# Patient Record
Sex: Female | Born: 1990
Health system: Southern US, Community
[De-identification: ages and names within clinical notes are randomized; demographics above are authoritative.]

## PROBLEM LIST (undated history)

## (undated) DIAGNOSIS — A4902 Methicillin resistant Staphylococcus aureus infection, unspecified site: Secondary | ICD-10-CM

## (undated) DIAGNOSIS — G8929 Other chronic pain: Secondary | ICD-10-CM

## (undated) DIAGNOSIS — F32A Depression, unspecified: Secondary | ICD-10-CM

## (undated) DIAGNOSIS — J45909 Unspecified asthma, uncomplicated: Secondary | ICD-10-CM

## (undated) DIAGNOSIS — M542 Cervicalgia: Secondary | ICD-10-CM

## (undated) DIAGNOSIS — Z8742 Personal history of other diseases of the female genital tract: Secondary | ICD-10-CM

## (undated) DIAGNOSIS — F329 Major depressive disorder, single episode, unspecified: Secondary | ICD-10-CM

## (undated) DIAGNOSIS — F419 Anxiety disorder, unspecified: Secondary | ICD-10-CM

## (undated) HISTORY — PX: TONSILLECTOMY: SUR1361

## (undated) HISTORY — PX: WISDOM TOOTH EXTRACTION: SHX21

## (undated) HISTORY — DX: Unspecified asthma, uncomplicated: J45.909

## (undated) HISTORY — PX: DILATION AND CURETTAGE OF UTERUS: SHX78

## (undated) HISTORY — PX: ADENOIDECTOMY: SUR15

## (undated) HISTORY — PX: EXPLORATORY LAPAROTOMY: SUR591

---

## 2014-11-20 DIAGNOSIS — F1990 Other psychoactive substance use, unspecified, uncomplicated: Secondary | ICD-10-CM | POA: Insufficient documentation

## 2016-10-22 ENCOUNTER — Encounter (HOSPITAL_COMMUNITY): Payer: Self-pay | Admitting: Obstetrics and Gynecology

## 2016-10-22 ENCOUNTER — Other Ambulatory Visit (HOSPITAL_COMMUNITY): Payer: Self-pay | Admitting: Obstetrics and Gynecology

## 2016-10-22 DIAGNOSIS — O280 Abnormal hematological finding on antenatal screening of mother: Secondary | ICD-10-CM

## 2016-11-02 ENCOUNTER — Encounter (HOSPITAL_COMMUNITY): Payer: Self-pay | Admitting: *Deleted

## 2016-11-05 ENCOUNTER — Encounter (HOSPITAL_COMMUNITY): Payer: Self-pay

## 2016-11-05 ENCOUNTER — Ambulatory Visit (HOSPITAL_COMMUNITY)
Admission: RE | Admit: 2016-11-05 | Discharge: 2016-11-05 | Disposition: A | Discharge status: Home / Self Care | Payer: Medicaid Other | Source: Ambulatory Visit | Attending: Obstetrics and Gynecology | Admitting: Obstetrics and Gynecology

## 2016-11-05 DIAGNOSIS — Z3A27 27 weeks gestation of pregnancy: Secondary | ICD-10-CM | POA: Diagnosis not present

## 2016-11-05 DIAGNOSIS — O280 Abnormal hematological finding on antenatal screening of mother: Secondary | ICD-10-CM | POA: Insufficient documentation

## 2016-11-05 NOTE — Progress Notes (Signed)
Genetic Counseling  High-Risk Gestation Note  Appointment Date:  11/05/2016 Referred By: Hassell DoneGaccione, Craig, MD Date of Birth:  May 16, 1991 Partner:  Mcneil SoberNathan Fonseca   Pregnancy History: [id number]: [id number] Estimated Date of Delivery: 01/30/17 Estimated Gestational Age: 972w5d Attending: Charlsie MerlesMark Newman, MD   Brianna Martin and her partner, Mr. Mcneil Soberathan Kall, were seen for consultation for genetic counseling because of an elevated MSAFP of 3.02 MoMs based on maternal serum screening through [facility].    In summary:  Discussed elevated MSAFP   Reviewed possible explanations for elevation  Discussed additional options  Ultrasound- performed today  Amniocentesis- declined  Discussed associations with unexplained elevated MSAFP  Reviewed family history concerns  We reviewed Mrs. Brianna Martin Zeman' maternal serum screening result, the elevation of MSAFP, and the associated 1 in 114 risk for a fetal open neural tube defect.   We reviewed open neural tube defects including: the typical multifactorial etiology and variable prognosis.  In addition, we discussed alternative explanations for an elevated MSAFP including: normal variation, twins, feto-maternal bleeding, a gestational dating error, abdominal wall defects, kidney differences, oligohydramnios, and placental problems.  We discussed that an unexplained elevation of MSAFP is associated with an increased risk for third trimester complications including: prematurity, low birth weight, and pre-eclampsia.    We reviewed additional available screening and diagnostic options including detailed ultrasound and amniocentesis.  We discussed the risks, limitations, and benefits of each.  After thoughtful consideration of these options, [name] elected to have ultrasound only, but declined amniocentesis.  She understands that ultrasound cannot rule out all birth defects or genetic syndromes.  However, she was counseled that 90% of fetuses with open  neural tube defects can be detected by detailed second trimester ultrasound, when well visualized.  A complete ultrasound was performed today. Visualized fetal anatomy within normal limits.  The ultrasound report will be sent under separate cover.  Both family histories were reviewed and found to be noncontributory for birth defects, intellectual disability, and known genetic conditions.  Without further information regarding the provided family history, an accurate genetic risk cannot be calculated. Further genetic counseling is warranted if more information is obtained.  Mrs. Brianna Martin Sautter denied exposure to environmental toxins or chemical agents. She denied the use of alcohol, tobacco or street drugs. She denied significant viral illnesses during the course of her pregnancy. Her medical and surgical histories were noncontributory.   I counseled this couple for approximately 35 minutes regarding the above risks and available options.    Quinn PlowmanKaren Neeta Storey, MS,  Certified Genetic Counselor 11/05/2016 11:11 AM

## 2016-11-05 NOTE — Addendum Note (Signed)
Encounter addended by: Aundra MilletKiser, Tanae Petrosky E on: 11/05/2016 11:00 AM<BR>    Actions taken: Imaging Exam ended

## 2016-11-06 ENCOUNTER — Other Ambulatory Visit (HOSPITAL_COMMUNITY): Payer: Self-pay | Admitting: *Deleted

## 2016-11-06 DIAGNOSIS — O280 Abnormal hematological finding on antenatal screening of mother: Secondary | ICD-10-CM

## 2016-11-08 ENCOUNTER — Ambulatory Visit (HOSPITAL_COMMUNITY): Payer: Self-pay

## 2016-11-08 ENCOUNTER — Encounter (HOSPITAL_COMMUNITY): Payer: Self-pay

## 2016-11-09 ENCOUNTER — Encounter (HOSPITAL_COMMUNITY): Payer: Self-pay

## 2016-11-09 ENCOUNTER — Other Ambulatory Visit: Payer: Self-pay

## 2016-12-07 ENCOUNTER — Ambulatory Visit (HOSPITAL_COMMUNITY)
Admission: RE | Admit: 2016-12-07 | Discharge: 2016-12-07 | Disposition: A | Discharge status: Home / Self Care | Payer: Medicaid Other | Source: Ambulatory Visit | Attending: Obstetrics and Gynecology | Admitting: Obstetrics and Gynecology

## 2016-12-07 ENCOUNTER — Encounter (HOSPITAL_COMMUNITY): Payer: Self-pay

## 2017-04-29 ENCOUNTER — Encounter (HOSPITAL_COMMUNITY): Payer: Self-pay

## 2018-06-03 ENCOUNTER — Ambulatory Visit (INDEPENDENT_AMBULATORY_CARE_PROVIDER_SITE_OTHER): Payer: Medicaid Other | Admitting: Family Medicine

## 2018-06-05 ENCOUNTER — Ambulatory Visit (INDEPENDENT_AMBULATORY_CARE_PROVIDER_SITE_OTHER): Payer: 59 | Admitting: Sports Medicine

## 2018-06-05 ENCOUNTER — Encounter: Payer: Self-pay | Admitting: Sports Medicine

## 2018-06-05 ENCOUNTER — Ambulatory Visit (INDEPENDENT_AMBULATORY_CARE_PROVIDER_SITE_OTHER): Payer: 59

## 2018-06-05 VITALS — BP 110/69 | HR 75 | Resp 16 | Ht 62.0 in | Wt 158.0 lb

## 2018-06-05 DIAGNOSIS — M779 Enthesopathy, unspecified: Secondary | ICD-10-CM

## 2018-06-05 DIAGNOSIS — M79671 Pain in right foot: Secondary | ICD-10-CM | POA: Diagnosis not present

## 2018-06-05 DIAGNOSIS — M21622 Bunionette of left foot: Secondary | ICD-10-CM

## 2018-06-05 DIAGNOSIS — F411 Generalized anxiety disorder: Secondary | ICD-10-CM | POA: Diagnosis not present

## 2018-06-05 DIAGNOSIS — E282 Polycystic ovarian syndrome: Secondary | ICD-10-CM | POA: Diagnosis not present

## 2018-06-05 DIAGNOSIS — Z6828 Body mass index (BMI) 28.0-28.9, adult: Secondary | ICD-10-CM | POA: Diagnosis not present

## 2018-06-05 DIAGNOSIS — Z0000 Encounter for general adult medical examination without abnormal findings: Secondary | ICD-10-CM | POA: Diagnosis not present

## 2018-06-05 DIAGNOSIS — M21621 Bunionette of right foot: Secondary | ICD-10-CM | POA: Diagnosis not present

## 2018-06-05 DIAGNOSIS — M79672 Pain in left foot: Secondary | ICD-10-CM | POA: Diagnosis not present

## 2018-06-05 MED ORDER — MELOXICAM 15 MG PO TABS: 15.0000 mg | ORAL_TABLET | Freq: Every day | ORAL | 0 refills | Status: DC

## 2018-06-05 MED ORDER — TRIAMCINOLONE ACETONIDE 10 MG/ML IJ SUSP
10.0000 mg | Freq: Once | INTRAMUSCULAR | Status: AC
  Administered 2018-06-05: 10 mg

## 2018-06-05 MED FILL — LORazepam 0.5 MG TABS: 0.5 | 30 days supply | Qty: 30 | Fill #0

## 2018-06-05 MED FILL — metFORMIN HCL 500 MG TABS: 500 | 90 days supply | Qty: 45 | Fill #0

## 2018-06-05 NOTE — Progress Notes (Signed)
Subjective: Brianna FieldsKelli Renee Martin is a 27 y.o. female patient who presents to office for evaluation of left greater than right foot pain. Patient complains of progressive pain especially over the last 6 weeks at the lateral side of the left greater than right foot at area of bunion. Ranks pain 8/10 and is now interferring with daily activities. Patient has tried Advil, Aleve, Epson salt soaks on occasion with no relief in symptoms.  Patient reports that pain is worse after work.  She has significant redness and swelling especially on the left. Patient denies any other pedal complaints. Denies injury/trip/fall/sprain/any causative factors.   Review of Systems  Musculoskeletal: Positive for joint pain and myalgias.       Joint swelling  All other systems reviewed and are negative.    Patient Active Problem List   Diagnosis Date Noted  . Abnormal MSAFP (maternal serum alpha-fetoprotein), elevated 11/05/2016  . Psychoactive substance use disorder 11/20/2014    Current Outpatient Medications on File Prior to Visit  Medication Sig Dispense Refill  . metFORMIN (GLUCOPHAGE) 1000 MG tablet Take 1,000 mg by mouth 2 (two) times daily with a meal.    . norethindrone (MICRONOR,CAMILA,ERRIN) 0.35 MG tablet Take by mouth.     No current facility-administered medications on file prior to visit.     No Known Allergies  Objective:  General: Alert and oriented x3 in no acute distress  Dermatology: No open lesions bilateral lower extremities, no webspace macerations, no ecchymosis bilateral, all nails x 10 are well manicured.  Vascular: Dorsalis Pedis and Posterior Tibial pedal pulses palpable, Capillary Fill Time 3 seconds,(+) pedal hair growth bilateral, no edema bilateral lower extremities, Temperature gradient within normal limits.  Neurology: Michaell CowingGross sensation intact via light touch bilateral.  Musculoskeletal: Mild tenderness with palpation at fifth metatarsal phalangeal joint on left at the tailor's  bunion.  No pain to right.  Strength within normal limits in all groups bilateral.   Gait: Antalgic gait  Xrays  Left and right foot   Impression: Normal osseous mineralization there is increased left greater than right 4-5 intermetatarsal angle supportive of tailor's bunion deformity with varus rotation of fifth toe, moderate soft tissue swelling on left.  Assessment and Plan: Problem List Items Addressed This Visit    None    Visit Diagnoses    Capsulitis    -  Primary   Relevant Medications   meloxicam (MOBIC) 15 MG tablet   triamcinolone acetonide (KENALOG) 10 MG/ML injection 10 mg (Completed)   Tailor's bunion of both feet       L>R   Relevant Medications   meloxicam (MOBIC) 15 MG tablet   Bilateral foot pain       Relevant Orders   DG Foot Complete Right   DG Foot Complete Left       -Complete examination performed -Xrays reviewed -Discussed treatement options for tailor's bunion with inflamed joint capsule left -After oral consent and aseptic prep, injected a mixture containing 1 ml of 2%  plain lidocaine, 1 ml 0.5% plain marcaine, 0.5 ml of kenalog 10 and 0.5 ml of dexamethasone phosphate into left fifth metatarsophalangeal joint without complication. Post-injection care discussed with patient.  -Prescribed meloxicam to take as instructed -Dispensed tailor bunions padding -Recommend good supportive shoes -Patient to return to office as needed or sooner if condition worsens.  Advised patient to call if pain is still present after 1 month or when ready for surgery consult for tailors bunion on left.  [name], DPM

## 2018-06-05 NOTE — Progress Notes (Signed)
   Subjective:    Patient ID: Brianna FieldsKelli Renee [place], female    DOB: 1991-03-23, 27 y.o.   MRN: [id number]  HPI    Review of Systems  Musculoskeletal: Positive for arthralgias, joint swelling and myalgias.  All other systems reviewed and are negative.      Objective:   Physical Exam        Assessment & Plan:

## 2018-06-06 ENCOUNTER — Telehealth: Payer: Self-pay | Admitting: Sports Medicine

## 2018-06-06 DIAGNOSIS — M21621 Bunionette of right foot: Secondary | ICD-10-CM

## 2018-06-06 DIAGNOSIS — M21622 Bunionette of left foot: Secondary | ICD-10-CM

## 2018-06-06 DIAGNOSIS — M779 Enthesopathy, unspecified: Secondary | ICD-10-CM

## 2018-06-06 MED ORDER — MELOXICAM 15 MG PO TABS: 15.0000 mg | ORAL_TABLET | Freq: Every day | ORAL | 0 refills | Status: DC

## 2018-06-06 MED FILL — MELOXICAM 15 MG TABLET: 15 | 30 days supply | Qty: 30 | Fill #0

## 2018-06-06 NOTE — Addendum Note (Signed)
Addended by: Alphia Kava'CONNELL, Adamarie Izzo D on: 06/06/2018 11:38 AM   Modules accepted: Orders

## 2018-06-06 NOTE — Telephone Encounter (Signed)
Orders [facility] to [facility].

## 2018-06-06 NOTE — Telephone Encounter (Signed)
Please send [facility]. She will take printed medication to Pharmacy when she picks it up

## 2018-06-13 ENCOUNTER — Other Ambulatory Visit: Payer: Self-pay | Admitting: Sports Medicine

## 2018-06-13 DIAGNOSIS — M79672 Pain in left foot: Principal | ICD-10-CM

## 2018-06-13 DIAGNOSIS — M21622 Bunionette of left foot: Secondary | ICD-10-CM

## 2018-06-13 DIAGNOSIS — M21621 Bunionette of right foot: Secondary | ICD-10-CM

## 2018-06-13 DIAGNOSIS — M779 Enthesopathy, unspecified: Secondary | ICD-10-CM

## 2018-06-13 DIAGNOSIS — M79671 Pain in right foot: Secondary | ICD-10-CM

## 2018-06-20 ENCOUNTER — Encounter: Payer: Self-pay | Admitting: Sports Medicine

## 2018-06-26 ENCOUNTER — Telehealth: Payer: Self-pay | Admitting: *Deleted

## 2018-06-26 NOTE — Telephone Encounter (Signed)
"  I'm calling about scheduling a surgery with Dr. Marylene Land.  Call me back."

## 2018-06-26 NOTE — Telephone Encounter (Signed)
"  I was told to give you a call to set up my surgery.  I'm scheduled to see Dr. Marylene Land on next Friday."  I don't have any information on you regarding the procedures and the location of the facility.  "So should I wait to see Dr. Marylene Land before I get it scheduled."  Yes, that is usually the protocol.  "Okay, thank you."

## 2018-07-04 ENCOUNTER — Ambulatory Visit (INDEPENDENT_AMBULATORY_CARE_PROVIDER_SITE_OTHER): Payer: 59 | Admitting: Sports Medicine

## 2018-07-04 ENCOUNTER — Encounter: Payer: Self-pay | Admitting: Sports Medicine

## 2018-07-04 DIAGNOSIS — M21622 Bunionette of left foot: Secondary | ICD-10-CM

## 2018-07-04 DIAGNOSIS — M79672 Pain in left foot: Secondary | ICD-10-CM | POA: Diagnosis not present

## 2018-07-04 DIAGNOSIS — M779 Enthesopathy, unspecified: Secondary | ICD-10-CM

## 2018-07-04 DIAGNOSIS — M79671 Pain in right foot: Secondary | ICD-10-CM

## 2018-07-04 DIAGNOSIS — M21621 Bunionette of right foot: Secondary | ICD-10-CM

## 2018-07-04 DIAGNOSIS — M2040 Other hammer toe(s) (acquired), unspecified foot: Secondary | ICD-10-CM | POA: Diagnosis not present

## 2018-07-04 NOTE — Progress Notes (Signed)
Subjective: Brianna Martin is a 28 y.o. female patient who returns to office for follow-up evaluation of left greater than right foot pain. Patient reports that she continues to have pain over her tailor's bunion especially on her left foot.  Patient states that the injection helped for a few days but then the pain is right back and very severe states that she is currently taking the meloxicam but feels like it is not helping and that pain is off and on at worst 6 out of 10.  Patient reports that the pain is consistently worse after work and that she has tried everything that she can think of including wider shoes rest ice elevation and additional padding without any improvement.  Patient states that she is here to discuss surgery.  Patient denies any other pedal complaints at this time.   Patient Active Problem List   Diagnosis Date Noted  . Abnormal MSAFP (maternal serum alpha-fetoprotein), elevated 11/05/2016  . Psychoactive substance use disorder 11/20/2014    Current Outpatient Medications on File Prior to Visit  Medication Sig Dispense Refill  . glyBURIDE-metformin (GLUCOVANCE) 1.25-250 MG tablet Take 1 tablet by mouth daily with breakfast.    . meloxicam (MOBIC) 15 MG tablet Take 1 tablet (15 mg total) by mouth daily. 30 tablet 0   No current facility-administered medications on file prior to visit.     No Known Allergies   History reviewed. No pertinent family history.  Past Surgical History:  Procedure Laterality Date  . CESAREAN SECTION    . DILATION AND CURETTAGE OF UTERUS    . EXPLORATORY LAPAROTOMY    . TONSILLECTOMY    . WISDOM TOOTH EXTRACTION     Social History   Socioeconomic History  . Marital status: Married    Spouse name: Not on file  . Number of children: Not on file  . Years of education: Not on file  . Highest education level: Not on file  Occupational History  . Not on file  Social Needs  . Financial resource strain: Not on file  . Food  insecurity:    Worry: Not on file    Inability: Not on file  . Transportation needs:    Medical: Not on file    Non-medical: Not on file  Tobacco Use  . Smoking status: Former Smoker    Packs/day: 0.50    Years: 2.00    Pack years: 1.00    Types: Cigarettes    Last attempt to quit: 06/21/2016    Years since quitting: 2.0  . Smokeless tobacco: Never Used  . Tobacco comment: no cigs, pt vapes now since 05/2016  Substance and Sexual Activity  . Alcohol use: No  . Drug use: No  . Sexual activity: Yes    Birth control/protection: None  Lifestyle  . Physical activity:    Days per week: Not on file    Minutes per session: Not on file  . Stress: Not on file  Relationships  . Social connections:    Talks on phone: Not on file    Gets together: Not on file    Attends religious service: Not on file    Active member of club or organization: Not on file    Attends meetings of clubs or organizations: Not on file    Relationship status: Not on file  . Intimate partner violence:    Fear of current or ex partner: Not on file    Emotionally abused: Not on file  Physically abused: Not on file    Forced sexual activity: Not on file  Other Topics Concern  . Not on file  Social History Narrative  . Not on file   Objective:  General: Alert and oriented x3 in no acute distress  Dermatology: No open lesions bilateral lower extremities, no webspace macerations, no ecchymosis bilateral, all nails x 10 are well manicured.  Vascular: Dorsalis Pedis and Posterior Tibial pedal pulses palpable, Capillary Fill Time 3 seconds,(+) pedal hair growth bilateral, no edema bilateral lower extremities, Temperature gradient within normal limits.  Neurology: Michaell CowingGross sensation intact via light touch bilateral.  Musculoskeletal: Mild tenderness with palpation at fifth metatarsal phalangeal joint on left at the tailor's bunion.  5th hammertoe deformity.  No pain to right.  Strength within normal limits in all  groups bilateral.   Assessment and Plan: Problem List Items Addressed This Visit    None    Visit Diagnoses    Capsulitis    -  Primary   Tailor's bunion of both feet       Left greater than right   Bilateral foot pain       Left greater than right   Hammer toe, unspecified laterality           -Complete examination performed -Xrays reviewed -Discussed treatement options for tailor's bunion with inflamed joint capsule left with hammertoe -Patient opt for surgical management. Consent obtained for left tailor's bunionectomy with internal fixation and fifth hammertoe repair.  Pre and Post op course explained. Risks, benefits, alternatives explained. No guarantees given or implied. Surgical booking slip submitted and provided patient with Surgical packet and info for [facility] day.  Patient to get history and physical completed. -Dispensed wedge postop shoe to use postoperatively and advised patient at surgical center we will see if she also needs crutches as well to help keep her steady in gait -Advised patient that the first week to 2 she will have to limit her weightbearing and after 1 week may go back to work however must have a sedentary job where she can elevate her foot -Patient to return to office after surgery or sooner if problems or issues arise.  Asencion Islamitorya Amethyst Gainer, DPM

## 2018-07-04 NOTE — Patient Instructions (Signed)
Pre-Operative Instructions  Congratulations, you have decided to take an important step towards improving your quality of life.  You can be assured that the doctors and staff at [facility] will be with you every step of the way.  Here are some important things you should know:  1. Plan to be at the surgery center/hospital at least 1 (one) hour prior to your scheduled time, unless otherwise directed by the surgical center/hospital staff.  You must have a responsible adult accompany you, remain during the surgery and drive you home.  Make sure you have directions to the surgical center/hospital to ensure you arrive on time. 2. If you are having surgery at [facility] or [facility] hospitals, you will need a copy of your medical history and physical form from your family physician within one month prior to the date of surgery. We will give you a form for your primary physician to complete.  3. We make every effort to accommodate the date you request for surgery.  However, there are times where surgery dates or times have to be moved.  We will contact you as soon as possible if a change in schedule is required.   4. No aspirin/ibuprofen for one week before surgery.  If you are on aspirin, any non-steroidal anti-inflammatory medications (Mobic, Aleve, Ibuprofen) should not be taken seven (7) days prior to your surgery.  You make take Tylenol for pain prior to surgery.  5. Medications - If you are taking daily heart and blood pressure medications, seizure, reflux, allergy, asthma, anxiety, pain or diabetes medications, make sure you notify the surgery center/hospital before the day of surgery so they can tell you which medications you should take or avoid the day of surgery. 6. No food or drink after midnight the night before surgery unless directed otherwise by surgical center/hospital staff. 7. No alcoholic beverages 24-hours prior to surgery.  No smoking 24-hours prior or 24-hours after  surgery. 8. Wear loose pants or shorts. They should be loose enough to fit over bandages, boots, and casts. 9. Don't wear slip-on shoes. Sneakers are preferred. 10. Bring your boot with you to the surgery center/hospital.  Also bring crutches or a walker if your physician has prescribed it for you.  If you do not have this equipment, it will be provided for you after surgery. 11. If you have not been contacted by the surgery center/hospital by the day before your surgery, call to confirm the date and time of your surgery. 12. Leave-time from work may vary depending on the type of surgery you have.  Appropriate arrangements should be made prior to surgery with your employer. 13. Prescriptions will be provided immediately following surgery by your doctor.  Fill these as soon as possible after surgery and take the medication as directed. Pain medications will not be refilled on weekends and must be approved by the doctor. 14. Remove nail polish on the operative foot and avoid getting pedicures prior to surgery. 15. Wash the night before surgery.  The night before surgery wash the foot and leg well with water and the antibacterial soap provided. Be sure to pay special attention to beneath the toenails and in between the toes.  Wash for at least three (3) minutes. Rinse thoroughly with water and dry well with a towel.  Perform this wash unless told not to do so by your physician.  Enclosed: 1 Ice pack (please put in freezer the night before surgery)   1 Hibiclens skin cleaner     Pre-op instructions  If you have any questions regarding the instructions, please do not hesitate to call our office.  [place]: [address], [place], [place] [address] -- [phone]  [place]: [address] [place], [place] [address] -- [phone]  [place]: [address]  [place], [place] [address] -- [phone]  [place]: [address], [address], [place], [place] [address] -- [phone]  Website:  [email or web address] 

## 2018-07-07 ENCOUNTER — Telehealth: Payer: Self-pay | Admitting: *Deleted

## 2018-07-07 NOTE — Telephone Encounter (Signed)
"  I'm calling to schedule my surgery."  Have you signed consent forms yet?  "I did on Friday."  Did she say what location your surgery will be performed?  "I'm having it at [facility] because I'm an employee of [facility]."  Do you have a date in mind?  "Yes, I'd like to do it February 17."  Did they give you the forms to have completed by your primary care physician?  Your visit must be between January 17 to February 17.  "Yes, they did."  (I need paperwork for this patient please.)

## 2018-07-08 NOTE — Telephone Encounter (Signed)
"  I'm calling to get my surgery scheduled.  Give me a call back."

## 2018-07-09 NOTE — Telephone Encounter (Signed)
I attempted to return her call.  I left her a message to call me back tomorrow. 

## 2018-07-11 MED FILL — LORazepam 0.5 MG TABS: 0.5 | 30 days supply | Qty: 30 | Fill #0

## 2018-07-15 NOTE — Telephone Encounter (Signed)
I am returning your call.  You want to schedule surgery with Dr. Marylene Land?  "Yes, I do."  Do you have a date that you would like to have it on?  "Yes, I'd like to do it on February 17."  That date is available.  The procedure will start at 1 pm but someone from the surgical center will give you a call a couple of days prior to the surgery date and they will give you your arrival time.  "Okay, thank you so much."

## 2018-07-23 ENCOUNTER — Ambulatory Visit (INDEPENDENT_AMBULATORY_CARE_PROVIDER_SITE_OTHER): Payer: 59 | Admitting: Orthopedic Surgery

## 2018-07-23 ENCOUNTER — Ambulatory Visit (INDEPENDENT_AMBULATORY_CARE_PROVIDER_SITE_OTHER): Payer: 59

## 2018-07-23 ENCOUNTER — Encounter (INDEPENDENT_AMBULATORY_CARE_PROVIDER_SITE_OTHER): Payer: Self-pay | Admitting: Orthopedic Surgery

## 2018-07-23 DIAGNOSIS — G8929 Other chronic pain: Secondary | ICD-10-CM

## 2018-07-23 DIAGNOSIS — M545 Low back pain, unspecified: Secondary | ICD-10-CM

## 2018-07-23 DIAGNOSIS — M542 Cervicalgia: Secondary | ICD-10-CM | POA: Diagnosis not present

## 2018-07-23 NOTE — Progress Notes (Signed)
Office Visit Note   Patient: Brianna Martin           Date of Birth: July 13, 1990           MRN: [id number] Visit Date: 07/23/2018 Requested by: No referring provider defined for this encounter. PCP: Brianna Height, PA-C  Subjective: Chief Complaint  Patient presents with  . Lower Back - Pain  . 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  She had an MRI scan 2015 which showed a little bit of left-sided L5 nerve root impingement.  She had 2 epidural steroid injections at that time which only helped her for a week.  She had an episode where she was having some back pain and she asked her husband to help crack her back.  She had bilateral leg numbness after that and she considered calling 911.  She reports pain primarily localizing in the lumbar sacral region.  Not really in the coccyx.  She has occasional bilateral leg numbness on the outsides of both legs but no saddle paresthesias.  She has been taking Mobic for her foot but that has not been helping her back.  This is the same thing that she had years ago but it is getting worse.  She has pain on a daily basis as well as pain that wakes her from sleep at night.  She also reports some tightness in her neck and shoulder region but no real radicular symptoms and no weakness in the arms.               ROS: All systems reviewed are negative as they relate to the chief complaint within the history of present illness.  Patient denies  fevers or chills.   Assessment & Plan: Visit Diagnoses:  1. Neck pain   2. Chronic low back pain, unspecified back pain laterality, unspecified whether sciatica present     Plan: Impression is neck pain with some loss of lordosis and likely muscle spasm in the neck but normal radiographs and normal exam.  I think that something we can watch.  The lower back is a little bit more problematic.  This does not look like a pilonidal cyst or anything of that  nature.  She does have some loss of her sacral contour on the AP pelvis on the right-hand side.  I think with her symptoms we should get MRI of the sacrum and the low back.  She is having night pain and worsening symptoms over the last 5 years.  There was some areas of increased signal in the sacrum on her prior MRI scan from 5 years ago.  We need to compare that to the new study.  Follow-Up Instructions: Return for after MRI.   Orders:  Orders Placed This Encounter  Procedures  . XR Lumbar Spine 2-3 Views  . XR Cervical Spine 2 or 3 views   No orders of the defined types were placed in this encounter.     Procedures: No procedures performed   Clinical Data: No additional findings.  Objective: Vital Signs: There were no vitals taken for this visit.  Physical Exam:   Constitutional: Patient appears well-developed HEENT:  Head: Normocephalic Eyes:EOM are normal Neck: Normal range of motion Cardiovascular: Normal rate Pulmonary/chest: Effort normal Neurologic: Patient is alert Skin: Skin is warm Psychiatric: Patient has normal mood and affect    Ortho Exam: Ortho exam demonstrates full active and passive  range of motion of the cervical spine.  Patient has 5 out of 5 grip EPL FPL interosseous wrist flexion extension bicep triceps and deltoid strength.  No paresthesias C5-T1.  Reflexes symmetric bilateral biceps triceps.  Not much in the way of nerve root tension signs.  No skin lesions in that sacral region.  Does have a little bit of tenderness to palpation at the top of the gluteal crease.  No trochanteric tenderness is noted.  Some pain with forward and lateral bending.  No muscle atrophy in the legs.  Specialty Comments:  No specialty comments available.  Imaging: Xr Cervical Spine 2 Or 3 Views  Result Date: 07/23/2018 AP lateral cervical spine reviewed.  There is loss of lordosis.  No significant degenerative disc disease or facet arthritis is present.  No malalignment  noted.  Xr Lumbar Spine 2-3 Views  Result Date: 07/23/2018 AP lateral lumbar spine reviewed.  Visualized hips appear normal.  Sacroiliac joints normal.  Lumbar spine alignment normal.  No significant degenerative disc disease present and no facet arthritis is present.    PMFS History: Patient Active Problem List   Diagnosis Date Noted  . Abnormal MSAFP (maternal serum alpha-fetoprotein), elevated 11/05/2016  . Psychoactive substance use disorder 11/20/2014   Past Medical History:  Diagnosis Date  . Medical history non-contributory     History reviewed. No pertinent family history.  Past Surgical History:  Procedure Laterality Date  . CESAREAN SECTION    . DILATION AND CURETTAGE OF UTERUS    . EXPLORATORY LAPAROTOMY    . TONSILLECTOMY    . WISDOM TOOTH EXTRACTION     Social History   Occupational History  . Not on file  Tobacco Use  . Smoking status: Former Smoker    Packs/day: 0.50    Years: 2.00    Pack years: 1.00    Types: Cigarettes    Last attempt to quit: 06/21/2016    Years since quitting: 2.0  . Smokeless tobacco: Never Used  . Tobacco comment: no cigs, pt vapes now since 05/2016  Substance and Sexual Activity  . Alcohol use: No  . Drug use: No  . Sexual activity: Yes    Birth control/protection: None

## 2018-07-24 ENCOUNTER — Encounter (INDEPENDENT_AMBULATORY_CARE_PROVIDER_SITE_OTHER): Payer: Self-pay | Admitting: Orthopedic Surgery

## 2018-07-24 ENCOUNTER — Encounter: Payer: Self-pay | Admitting: Sports Medicine

## 2018-07-25 ENCOUNTER — Telehealth: Payer: Self-pay | Admitting: *Deleted

## 2018-07-25 MED ORDER — TRAMADOL HCL 50 MG PO TABS: ORAL_TABLET | ORAL | 0 refills | Status: DC

## 2018-07-25 MED FILL — traMADol HCL 50 MG TABS: 50 | 6 days supply | Qty: 20 | Fill #0

## 2018-07-25 NOTE — Telephone Encounter (Signed)
Left tramadol orders.

## 2018-07-27 NOTE — Telephone Encounter (Signed)
Loss of sacral contouring on the right which means I cannot really see the cortical edge of the bone is clearly in the sacrum on the right is on the left.  For that reason and in accordance with the pain that appears to be primarily below and within your gluteal region MRI of both the lower back and sacrum is indicated

## 2018-08-01 ENCOUNTER — Other Ambulatory Visit: Payer: Self-pay

## 2018-08-01 ENCOUNTER — Encounter (HOSPITAL_BASED_OUTPATIENT_CLINIC_OR_DEPARTMENT_OTHER): Payer: Self-pay | Admitting: *Deleted

## 2018-08-04 MED FILL — ESCITALOPRAM 10 MG TABLET: 10 | 30 days supply | Qty: 30 | Fill #0

## 2018-08-06 ENCOUNTER — Ambulatory Visit
Admission: RE | Admit: 2018-08-06 | Discharge: 2018-08-06 | Disposition: A | Discharge status: Home / Self Care | Payer: Medicaid Other | Source: Ambulatory Visit | Attending: Orthopedic Surgery | Admitting: Orthopedic Surgery

## 2018-08-06 ENCOUNTER — Telehealth: Payer: Self-pay | Admitting: *Deleted

## 2018-08-06 ENCOUNTER — Ambulatory Visit
Admission: RE | Admit: 2018-08-06 | Discharge: 2018-08-06 | Disposition: A | Discharge status: Home / Self Care | Payer: 59 | Source: Ambulatory Visit | Attending: Orthopedic Surgery | Admitting: Orthopedic Surgery

## 2018-08-06 DIAGNOSIS — M545 Low back pain: Principal | ICD-10-CM

## 2018-08-06 DIAGNOSIS — M5124 Other intervertebral disc displacement, thoracic region: Secondary | ICD-10-CM | POA: Diagnosis not present

## 2018-08-06 DIAGNOSIS — G8929 Other chronic pain: Secondary | ICD-10-CM

## 2018-08-06 DIAGNOSIS — M48061 Spinal stenosis, lumbar region without neurogenic claudication: Secondary | ICD-10-CM | POA: Diagnosis not present

## 2018-08-06 DIAGNOSIS — M549 Dorsalgia, unspecified: Secondary | ICD-10-CM | POA: Diagnosis not present

## 2018-08-06 NOTE — Telephone Encounter (Signed)
Doing it now.

## 2018-08-06 NOTE — Telephone Encounter (Signed)
"  I need Dr. Marylene Land to put in orders for [facility].  She's scheduled for Monday."  I'll let her know.

## 2018-08-08 ENCOUNTER — Ambulatory Visit (INDEPENDENT_AMBULATORY_CARE_PROVIDER_SITE_OTHER): Payer: 59 | Admitting: Allergy

## 2018-08-08 ENCOUNTER — Encounter: Payer: Self-pay | Admitting: Allergy

## 2018-08-08 ENCOUNTER — Telehealth: Payer: Self-pay | Admitting: *Deleted

## 2018-08-08 ENCOUNTER — Encounter (HOSPITAL_BASED_OUTPATIENT_CLINIC_OR_DEPARTMENT_OTHER)
Admission: RE | Admit: 2018-08-08 | Discharge: 2018-08-08 | Disposition: A | Discharge status: Home / Self Care | Payer: 59 | Source: Ambulatory Visit | Attending: Sports Medicine | Admitting: Sports Medicine

## 2018-08-08 VITALS — BP 118/82 | HR 76 | Temp 97.8°F | Resp 16 | Ht 62.0 in | Wt 159.0 lb

## 2018-08-08 DIAGNOSIS — M21622 Bunionette of left foot: Secondary | ICD-10-CM | POA: Diagnosis not present

## 2018-08-08 DIAGNOSIS — Z01812 Encounter for preprocedural laboratory examination: Secondary | ICD-10-CM

## 2018-08-08 DIAGNOSIS — F419 Anxiety disorder, unspecified: Secondary | ICD-10-CM | POA: Diagnosis not present

## 2018-08-08 DIAGNOSIS — Z7984 Long term (current) use of oral hypoglycemic drugs: Secondary | ICD-10-CM | POA: Diagnosis not present

## 2018-08-08 DIAGNOSIS — Z01810 Encounter for preprocedural cardiovascular examination: Secondary | ICD-10-CM | POA: Diagnosis not present

## 2018-08-08 DIAGNOSIS — M2042 Other hammer toe(s) (acquired), left foot: Secondary | ICD-10-CM | POA: Diagnosis not present

## 2018-08-08 DIAGNOSIS — F17290 Nicotine dependence, other tobacco product, uncomplicated: Secondary | ICD-10-CM | POA: Diagnosis not present

## 2018-08-08 DIAGNOSIS — J45909 Unspecified asthma, uncomplicated: Secondary | ICD-10-CM | POA: Diagnosis not present

## 2018-08-08 DIAGNOSIS — Z0000 Encounter for general adult medical examination without abnormal findings: Secondary | ICD-10-CM

## 2018-08-08 DIAGNOSIS — Z833 Family history of diabetes mellitus: Secondary | ICD-10-CM | POA: Diagnosis not present

## 2018-08-08 DIAGNOSIS — F329 Major depressive disorder, single episode, unspecified: Secondary | ICD-10-CM | POA: Diagnosis not present

## 2018-08-08 LAB — BASIC METABOLIC PANEL
Anion gap: 6 (ref 5–15)
BUN: 14 mg/dL (ref 6–20)
CO2: 27 mmol/L (ref 22–32)
Calcium: 9.7 mg/dL (ref 8.9–10.3)
Chloride: 108 mmol/L (ref 98–111)
Creatinine, Ser: 0.67 mg/dL (ref 0.44–1.00)
GFR calc Af Amer: >60 mL/min (ref >60)
Glucose, Bld: 122 mg/dL — ABNORMAL HIGH (ref 70–99)
Potassium: 5.3 mmol/L — ABNORMAL HIGH (ref 3.5–5.1)
Sodium: 141 mmol/L (ref 135–145)

## 2018-08-08 LAB — POCT PREGNANCY, URINE: Preg Test, Ur: NEGATIVE

## 2018-08-08 NOTE — Telephone Encounter (Addendum)
Brianna Martin from [facility] sent me a message stating that the history and physical was expired, it was not in the 30 day window.  The physical was done in March and the doctor signed the form on 07/09/2018.  So both dates were not within the required time period.  I called and informed Brianna Martin.  She's going to call her doctor's office and see if she can get an appointment for early Monday morning.

## 2018-08-08 NOTE — Telephone Encounter (Signed)
"  I was able to get in with Dr. Margo Aye.  I had my physical and she filled out the form.  Can you make sure you have it?"  Yes, it's here I will fax it to the surgical center.  "Do I need anything else?"  I am not aware of anything else that is needed.  I faxed the form to the surgical center.

## 2018-08-08 NOTE — Telephone Encounter (Addendum)
I received an IM from [place] at the surgical center stating they had not received Brianna Martin's history and physical form.  She's scheduled for surgery on Monday, August 11, 2018.   "I'm calling you in regards to your history and physical form.  We do not have a copy of it.  Can you call your doctor's office and request them to fax it to me.  "Sure, I can do that but I need to do it now because they close at 12 pm.  What's your fax number?"  My fax number is [phone].  "I'll call now."   "I'm calling to see if you received the fax from my doctor's office."  I'll go check.  Yes, we received it.  I'm faxing it as we speak.  Thank you so much.  "Okay great, thank you."

## 2018-08-08 NOTE — Progress Notes (Signed)
Pt here for labs.  Pt aware of need for updated H&P prior to surgery on Monday.  H&P form given.

## 2018-08-08 NOTE — Patient Instructions (Signed)
Healthy adult  - she is in normal state of health  - she has same risk as general population for complications related to surgery.    - she does have not any medical conditions at this time that would put her at risk for complications related to anesthesia.    F/u as needed

## 2018-08-08 NOTE — Progress Notes (Signed)
New Patient Note  RE: Brianna Martin MRN: [id number] DOB: 06/03/1991 Date of Office Visit: 08/08/2018  Referring provider: Gus Height, PA-C Primary care provider: Gus Height, PA-C  Chief Complaint: pre-op clearance  History of present illness: Brianna Martin is a 28 y.o. female presenting today for consultation for pre-op clearance.  She has been having foot pain and is set to have foot surgery with her podiatrist next week.  She otherwise states she has been in her normal state of health without any recent illnesses, hospitalizations.  She denies any previous issues with anesthesia in the past.  She denies any cardiac or respiratory issues.     Review of systems: Review of Systems  Constitutional: Negative for chills, fever and malaise/fatigue.  HENT: Negative for congestion and nosebleeds.   Eyes: Negative for discharge and redness.  Respiratory: Negative for cough, shortness of breath and wheezing.   Cardiovascular: Negative for chest pain.  Gastrointestinal: Negative for abdominal pain, constipation, diarrhea, nausea and vomiting.  Musculoskeletal: Negative for joint pain.  Skin: Negative for itching and rash.  Neurological: Negative for headaches.    All other systems negative unless noted above in HPI  Past medical history: Past Medical History:  Diagnosis Date  . Anxiety   . Asthma   . Depression   . History of PCOS   . Neck pain, chronic     Past surgical history: Past Surgical History:  Procedure Laterality Date  . ADENOIDECTOMY    . CESAREAN SECTION    . DILATION AND CURETTAGE OF UTERUS    . EXPLORATORY LAPAROTOMY    . TONSILLECTOMY    . WISDOM TOOTH EXTRACTION      Family history:  Family History  Problem Relation Age of Onset  . Urticaria Neg Hx   . Immunodeficiency Neg Hx   . Eczema Neg Hx   . Atopy Neg Hx   . Asthma Neg Hx   . Angioedema Neg Hx   . Allergic rhinitis Neg Hx     Social history: Lives with family in home.   She is a CMA.    Medication List: Allergies as of 08/08/2018   No Known Allergies     Medication List       Accurate as of August 08, 2018  5:21 PM. Always use your most recent med list.        escitalopram 10 MG tablet Commonly known as:  LEXAPRO   meloxicam 15 MG tablet Commonly known as:  MOBIC Take 1 tablet (15 mg total) by mouth daily.   metFORMIN 500 MG tablet Commonly known as:  GLUCOPHAGE Take 250 mg by mouth daily with breakfast.       Known medication allergies: No Known Allergies   Physical examination: Blood pressure 118/82, pulse 76, temperature 97.8 F (36.6 C), temperature source Oral, resp. rate 16, height 5\' 2"  (1.575 m), weight 159 lb (72.1 kg), SpO2 99 %, currently breastfeeding.  General: Alert, interactive, in no acute distress. HEENT: PERRLA, TMs pearly gray, turbinates non-edematous without discharge, post-pharynx non erythematous. Neck: Supple without lymphadenopathy. Lungs: Clear to auscultation without wheezing, rhonchi or rales. {no increased work of breathing. CV: Normal S1, S2 without murmurs. Abdomen: Nondistended, nontender. Skin: Warm and dry, without lesions or rashes. Extremities:  No clubbing, cyanosis or edema. Neuro:   Grossly intact.  Diagnositics/Labs: None  Assessment and plan:   Healthy adult  - she is in normal state of health  - she has same risk as general  population for complications related to surgery.    - she does have not any medical conditions at this time that would put her at risk for complications related to anesthesia.    F/u as needed   No follow-ups on file.  I appreciate the opportunity to take part in [facility] care. Please do not hesitate to contact me with questions.  Sincerely,   Margo Aye, MD Allergy/Immunology [facility]

## 2018-08-11 ENCOUNTER — Ambulatory Visit (HOSPITAL_BASED_OUTPATIENT_CLINIC_OR_DEPARTMENT_OTHER)
Admission: RE | Admit: 2018-08-11 | Discharge: 2018-08-11 | Disposition: A | Discharge status: Home / Self Care | Payer: 59 | Attending: Sports Medicine | Admitting: Sports Medicine

## 2018-08-11 ENCOUNTER — Encounter (HOSPITAL_BASED_OUTPATIENT_CLINIC_OR_DEPARTMENT_OTHER)
Admission: RE | Disposition: A | Discharge status: Home / Self Care | Payer: Self-pay | Source: Home / Self Care | Attending: Sports Medicine

## 2018-08-11 ENCOUNTER — Encounter: Payer: Self-pay | Admitting: Sports Medicine

## 2018-08-11 ENCOUNTER — Encounter (HOSPITAL_BASED_OUTPATIENT_CLINIC_OR_DEPARTMENT_OTHER): Payer: Self-pay

## 2018-08-11 ENCOUNTER — Ambulatory Visit (HOSPITAL_BASED_OUTPATIENT_CLINIC_OR_DEPARTMENT_OTHER): Payer: 59 | Admitting: Anesthesiology

## 2018-08-11 ENCOUNTER — Other Ambulatory Visit: Payer: Self-pay

## 2018-08-11 DIAGNOSIS — M2042 Other hammer toe(s) (acquired), left foot: Secondary | ICD-10-CM | POA: Insufficient documentation

## 2018-08-11 DIAGNOSIS — F17290 Nicotine dependence, other tobacco product, uncomplicated: Secondary | ICD-10-CM | POA: Diagnosis not present

## 2018-08-11 DIAGNOSIS — F419 Anxiety disorder, unspecified: Secondary | ICD-10-CM | POA: Insufficient documentation

## 2018-08-11 DIAGNOSIS — F329 Major depressive disorder, single episode, unspecified: Secondary | ICD-10-CM | POA: Diagnosis not present

## 2018-08-11 DIAGNOSIS — Z7984 Long term (current) use of oral hypoglycemic drugs: Secondary | ICD-10-CM | POA: Diagnosis not present

## 2018-08-11 DIAGNOSIS — J45909 Unspecified asthma, uncomplicated: Secondary | ICD-10-CM | POA: Diagnosis not present

## 2018-08-11 DIAGNOSIS — M21622 Bunionette of left foot: Secondary | ICD-10-CM | POA: Diagnosis not present

## 2018-08-11 DIAGNOSIS — Z833 Family history of diabetes mellitus: Secondary | ICD-10-CM | POA: Diagnosis not present

## 2018-08-11 DIAGNOSIS — Z9889 Other specified postprocedural states: Secondary | ICD-10-CM

## 2018-08-11 DIAGNOSIS — M2012 Hallux valgus (acquired), left foot: Secondary | ICD-10-CM | POA: Diagnosis not present

## 2018-08-11 HISTORY — DX: Major depressive disorder, single episode, unspecified: F32.9

## 2018-08-11 HISTORY — DX: Personal history of other diseases of the female genital tract: Z87.42

## 2018-08-11 HISTORY — PX: HAMMER TOE SURGERY: SHX385

## 2018-08-11 HISTORY — DX: Depression, unspecified: F32.A

## 2018-08-11 HISTORY — DX: Anxiety disorder, unspecified: F41.9

## 2018-08-11 HISTORY — DX: Other chronic pain: G89.29

## 2018-08-11 HISTORY — DX: Cervicalgia: M54.2

## 2018-08-11 HISTORY — PX: BUNIONECTOMY: SHX129

## 2018-08-11 HISTORY — DX: Methicillin resistant Staphylococcus aureus infection, unspecified site: A49.02

## 2018-08-11 SURGERY — BUNIONECTOMY
Anesthesia: Monitor Anesthesia Care | Laterality: Left

## 2018-08-11 MED ORDER — ONDANSETRON HCL 4 MG/2ML IJ SOLN
INTRAMUSCULAR | Status: AC
  Filled 2018-08-11: qty 2

## 2018-08-11 MED ORDER — ONDANSETRON HCL 4 MG/2ML IJ SOLN
INTRAMUSCULAR | Status: DC | PRN
  Administered 2018-08-11: 4 mg via INTRAVENOUS

## 2018-08-11 MED ORDER — CEFAZOLIN SODIUM-DEXTROSE 2-4 GM/100ML-% IV SOLN
INTRAVENOUS | Status: AC
  Filled 2018-08-11: qty 100

## 2018-08-11 MED ORDER — CLONIDINE HCL (ANALGESIA) 100 MCG/ML EP SOLN
EPIDURAL | Status: DC | PRN
  Administered 2018-08-11: 50 ug

## 2018-08-11 MED ORDER — PROPOFOL 500 MG/50ML IV EMUL
INTRAVENOUS | Status: DC | PRN
  Administered 2018-08-11: 125 ug/kg/min via INTRAVENOUS

## 2018-08-11 MED ORDER — PROPOFOL 10 MG/ML IV BOLUS
INTRAVENOUS | Status: DC | PRN
  Administered 2018-08-11: 40 mg via INTRAVENOUS
  Administered 2018-08-11: 30 mg via INTRAVENOUS

## 2018-08-11 MED ORDER — LACTATED RINGERS IV SOLN
INTRAVENOUS | Status: DC
  Administered 2018-08-11: 12:00:00 via INTRAVENOUS

## 2018-08-11 MED ORDER — ACETAMINOPHEN 500 MG PO TABS
1000.0000 mg | ORAL_TABLET | Freq: Once | ORAL | Status: AC
  Administered 2018-08-11: 1000 mg via ORAL

## 2018-08-11 MED ORDER — BUPIVACAINE HCL (PF) 0.5 % IJ SOLN
INTRAMUSCULAR | Status: AC
  Filled 2018-08-11: qty 30

## 2018-08-11 MED ORDER — DEXMEDETOMIDINE HCL IN NACL 200 MCG/50ML IV SOLN
INTRAVENOUS | Status: DC | PRN
  Administered 2018-08-11: 8 ug via INTRAVENOUS

## 2018-08-11 MED ORDER — HYDROCODONE-ACETAMINOPHEN 10-325 MG PO TABS: 1.0000 | ORAL_TABLET | Freq: Four times a day (QID) | ORAL | 0 refills | Status: AC | PRN

## 2018-08-11 MED ORDER — DOCUSATE SODIUM 100 MG PO CAPS: 100.0000 mg | ORAL_CAPSULE | Freq: Every day | ORAL | 2 refills | Status: AC | PRN

## 2018-08-11 MED ORDER — MIDAZOLAM HCL 2 MG/2ML IJ SOLN
1.0000 mg | INTRAMUSCULAR | Status: DC | PRN
  Administered 2018-08-11: 2 mg via INTRAVENOUS

## 2018-08-11 MED ORDER — ROPIVACAINE HCL 7.5 MG/ML IJ SOLN
INTRAMUSCULAR | Status: DC | PRN
  Administered 2018-08-11: 20 mL via PERINEURAL

## 2018-08-11 MED ORDER — LIDOCAINE HCL (PF) 1 % IJ SOLN
INTRAMUSCULAR | Status: AC
  Filled 2018-08-11: qty 30

## 2018-08-11 MED ORDER — FENTANYL CITRATE (PF) 100 MCG/2ML IJ SOLN
INTRAMUSCULAR | Status: AC
  Filled 2018-08-11: qty 2

## 2018-08-11 MED ORDER — CHLORHEXIDINE GLUCONATE CLOTH 2 % EX PADS: 6.0000 | MEDICATED_PAD | Freq: Once | CUTANEOUS | Status: DC

## 2018-08-11 MED ORDER — CEFAZOLIN SODIUM-DEXTROSE 2-4 GM/100ML-% IV SOLN
2.0000 g | INTRAVENOUS | Status: AC
  Administered 2018-08-11: 2 g via INTRAVENOUS

## 2018-08-11 MED ORDER — MIDAZOLAM HCL 2 MG/2ML IJ SOLN
INTRAMUSCULAR | Status: AC
  Filled 2018-08-11: qty 2

## 2018-08-11 MED ORDER — SCOPOLAMINE 1 MG/3DAYS TD PT72: 1.0000 | MEDICATED_PATCH | Freq: Once | TRANSDERMAL | Status: DC | PRN

## 2018-08-11 MED ORDER — PROMETHAZINE HCL 25 MG PO TABS: 25.0000 mg | ORAL_TABLET | Freq: Four times a day (QID) | ORAL | 0 refills | Status: DC | PRN

## 2018-08-11 MED ORDER — FENTANYL CITRATE (PF) 100 MCG/2ML IJ SOLN: 25.0000 ug | INTRAMUSCULAR | Status: DC | PRN

## 2018-08-11 MED ORDER — IBUPROFEN 800 MG PO TABS: 800.0000 mg | ORAL_TABLET | Freq: Three times a day (TID) | ORAL | 0 refills | Status: DC | PRN

## 2018-08-11 MED ORDER — FENTANYL CITRATE (PF) 100 MCG/2ML IJ SOLN
50.0000 ug | INTRAMUSCULAR | Status: DC | PRN
  Administered 2018-08-11: 100 ug via INTRAVENOUS

## 2018-08-11 MED ORDER — 0.9 % SODIUM CHLORIDE (POUR BTL) OPTIME
TOPICAL | Status: DC | PRN
  Administered 2018-08-11: 200 mL

## 2018-08-11 MED ORDER — PROPOFOL 500 MG/50ML IV EMUL
INTRAVENOUS | Status: AC
  Filled 2018-08-11: qty 50

## 2018-08-11 MED ORDER — EPINEPHRINE PF 1 MG/10ML IJ SOSY
PREFILLED_SYRINGE | INTRAMUSCULAR | Status: DC | PRN
  Administered 2018-08-11: 100 ug via SUBCUTANEOUS

## 2018-08-11 MED ORDER — ACETAMINOPHEN 500 MG PO TABS
ORAL_TABLET | ORAL | Status: AC
  Filled 2018-08-11: qty 2

## 2018-08-11 MED FILL — PROMETHAZINE 25 MG TABLET: 25 | 8 days supply | Qty: 30 | Fill #0

## 2018-08-11 MED FILL — HYDROCODON-APAP 10-325: 10-325 | 7 days supply | Qty: 28 | Fill #0

## 2018-08-11 MED FILL — IBUPROFEN 800 MG TABS: 800 | 10 days supply | Qty: 30 | Fill #0

## 2018-08-11 SURGICAL SUPPLY — 65 items
BANDAGE ACE 3X5.8 VEL STRL LF (GAUZE/BANDAGES/DRESSINGS) ×3 IMPLANT
BLADE AVERAGE 25MMX9MM (BLADE) ×1
BLADE AVERAGE 25X9 (BLADE) ×2 IMPLANT
BLADE OSC/SAG .038X5.5 CUT EDG (BLADE) ×3 IMPLANT
BLADE SURG 15 STRL LF DISP TIS (BLADE) ×2 IMPLANT
BLADE SURG 15 STRL SS (BLADE) ×4
BNDG COHESIVE 3X5 TAN STRL LF (GAUZE/BANDAGES/DRESSINGS) ×3 IMPLANT
BNDG COHESIVE 4X5 TAN STRL (GAUZE/BANDAGES/DRESSINGS) ×4 IMPLANT
BNDG CONFORM 2 STRL LF (GAUZE/BANDAGES/DRESSINGS) ×3 IMPLANT
BNDG ESMARK 4X9 LF (GAUZE/BANDAGES/DRESSINGS) ×3 IMPLANT
BNDG GAUZE ELAST 4 BULKY (GAUZE/BANDAGES/DRESSINGS) ×3 IMPLANT
COVER BACK TABLE 60X90IN (DRAPES) ×3 IMPLANT
COVER WAND RF STERILE (DRAPES) IMPLANT
CUFF TOURNIQUET SINGLE 18IN (TOURNIQUET CUFF) ×3 IMPLANT
DRAPE EXTREMITY T 121X128X90 (DISPOSABLE) ×3 IMPLANT
DRAPE IMP U-DRAPE 54X76 (DRAPES) ×3 IMPLANT
DRAPE OEC MINIVIEW 54X84 (DRAPES) ×3 IMPLANT
DRAPE SURG 17X23 STRL (DRAPES) ×3 IMPLANT
DRSG EMULSION OIL 3X3 NADH (GAUZE/BANDAGES/DRESSINGS) ×3 IMPLANT
DRSG PAD ABDOMINAL 8X10 ST (GAUZE/BANDAGES/DRESSINGS) IMPLANT
DURAPREP 26ML APPLICATOR (WOUND CARE) ×3 IMPLANT
ELECT REM PT RETURN 9FT ADLT (ELECTROSURGICAL) ×3
ELECTRODE REM PT RTRN 9FT ADLT (ELECTROSURGICAL) ×1 IMPLANT
GAUZE 4X4 16PLY RFD (DISPOSABLE) IMPLANT
GAUZE SPONGE 4X4 12PLY STRL (GAUZE/BANDAGES/DRESSINGS) ×3 IMPLANT
GAUZE XEROFORM 1X8 LF (GAUZE/BANDAGES/DRESSINGS) ×3 IMPLANT
GLOVE BIO SURGEON STRL SZ 6.5 (GLOVE) ×2 IMPLANT
GLOVE BIO SURGEONS STRL SZ 6.5 (GLOVE) ×1
GLOVE BIOGEL M 6.5 STRL (GLOVE) ×2 IMPLANT
GLOVE BIOGEL M 7.0 STRL (GLOVE) ×2 IMPLANT
GLOVE BIOGEL PI IND STRL 6.5 (GLOVE) ×1 IMPLANT
GLOVE BIOGEL PI INDICATOR 6.5 (GLOVE) ×4
GLOVE ECLIPSE 6.5 STRL STRAW (GLOVE) ×2 IMPLANT
GOWN STRL REUS W/ TWL LRG LVL3 (GOWN DISPOSABLE) ×2 IMPLANT
GOWN STRL REUS W/TWL LRG LVL3 (GOWN DISPOSABLE) ×4
GUIDEWIRE 0.9X100 SMOOTH (WIRE) IMPLANT
GUIDWIRE 0.90MM X 100MM SMOOTH (WIRE) ×4
NDL HYPO 25X1 1.5 SAFETY (NEEDLE) ×2 IMPLANT
NDL SAFETY ECLIPSE 18X1.5 (NEEDLE) ×1 IMPLANT
NEEDLE HYPO 18GX1.5 SHARP (NEEDLE) ×2
NEEDLE HYPO 25X1 1.5 SAFETY (NEEDLE) ×6 IMPLANT
NS IRRIG 1000ML POUR BTL (IV SOLUTION) IMPLANT
PACK BASIN DAY SURGERY FS (CUSTOM PROCEDURE TRAY) ×3 IMPLANT
PADDING CAST ABS 4INX4YD NS (CAST SUPPLIES) ×2
PADDING CAST ABS COTTON 4X4 ST (CAST SUPPLIES) ×1 IMPLANT
PENCIL BUTTON HOLSTER BLD 10FT (ELECTRODE) ×3 IMPLANT
SCREW TC 2.0X14 (Screw) ×2 IMPLANT
SCREW TC 2.0X16 (Screw) ×2 IMPLANT
SLEEVE SCD COMPRESS KNEE MED (MISCELLANEOUS) ×3 IMPLANT
STOCKINETTE 6  STRL (DRAPES) ×2
STOCKINETTE 6 STRL (DRAPES) ×1 IMPLANT
STOCKINETTE SYNTHETIC 4 NONSTR (MISCELLANEOUS) ×3 IMPLANT
SUT ETHILON 3 0 PS 1 (SUTURE) ×3 IMPLANT
SUT ETHILON 4 0 PS 2 18 (SUTURE) IMPLANT
SUT ETHILON 5 0 PS 2 18 (SUTURE) ×2 IMPLANT
SUT MNCRL AB 3-0 PS2 18 (SUTURE) IMPLANT
SUT MNCRL AB 4-0 PS2 18 (SUTURE) ×2 IMPLANT
SUT MON AB 5-0 PS2 18 (SUTURE) ×2 IMPLANT
SUT SILK 4 0 PS 2 (SUTURE) ×2 IMPLANT
SUT VIC AB 3-0 FS2 27 (SUTURE) ×5 IMPLANT
SUT VICRYL 4-0 PS2 18IN ABS (SUTURE) ×3 IMPLANT
SYR 10ML LL (SYRINGE) ×6 IMPLANT
SYR BULB 3OZ (MISCELLANEOUS) ×3 IMPLANT
TOWEL GREEN STERILE FF (TOWEL DISPOSABLE) ×3 IMPLANT
UNDERPAD 30X30 (UNDERPADS AND DIAPERS) ×3 IMPLANT

## 2018-08-11 NOTE — Anesthesia Preprocedure Evaluation (Addendum)
Anesthesia Evaluation  Patient identified by MRN, date of birth, ID band Patient awake    Reviewed: Allergy & Precautions, NPO status , Patient's Chart, lab work & pertinent test results  Airway Mallampati: I  TM Distance: >3 FB Neck ROM: Full    Dental no notable dental hx. (+) Teeth Intact, Dental Advisory Given   Pulmonary asthma , former smoker,    Pulmonary exam normal breath sounds clear to auscultation       Cardiovascular negative cardio ROS Normal cardiovascular exam Rhythm:Regular Rate:Normal     Neuro/Psych PSYCHIATRIC DISORDERS Anxiety Depression negative neurological ROS     GI/Hepatic negative GI ROS, Neg liver ROS,   Endo/Other  PCOS  Renal/GU negative Renal ROS  negative genitourinary   Musculoskeletal negative musculoskeletal ROS (+)   Abdominal   Peds  Hematology negative hematology ROS (+)   Anesthesia Other Findings   Reproductive/Obstetrics negative OB ROS                            Anesthesia Physical Anesthesia Plan  ASA: II  Anesthesia Plan: MAC and Regional   Post-op Pain Management:  Regional for Post-op pain   Induction: Intravenous  PONV Risk Score and Plan: 2 and Ondansetron, Dexamethasone and Midazolam  Airway Management Planned: Natural Airway and Simple Face Mask  Additional Equipment:   Intra-op Plan:   Post-operative Plan:   Informed Consent: I have reviewed the patients History and Physical, chart, labs and discussed the procedure including the risks, benefits and alternatives for the proposed anesthesia with the patient or authorized representative who has indicated his/her understanding and acceptance.     Dental advisory given  Plan Discussed with: CRNA  Anesthesia Plan Comments:         Anesthesia Quick Evaluation

## 2018-08-11 NOTE — Brief Op Note (Signed)
08/11/2018  1:55 PM  PATIENT:  Brianna Martin  28 y.o. female  PRE-OPERATIVE DIAGNOSIS:  hammertoe and Taylor's bunion left foot  POST-OPERATIVE DIAGNOSIS:  hammertoe and Taylor's bunion left foot  PROCEDURE:  Procedure(s): TAYLOR'S BUNIONECTOMY LEFT FOOT (Left) 5TH HAMMER TOE CORRECTION LEFT FOOT (Left)  SURGEON:  Surgeon(s) and Role:    * Asencion Islam, DPM - Primary  PHYSICIAN ASSISTANT:   ASSISTANTS: none   ANESTHESIA:   regional  EBL:  1 mL   BLOOD ADMINISTERED:none  DRAINS: none   LOCAL MEDICATIONS USED:  NONE  SPECIMEN:  No Specimen  DISPOSITION OF SPECIMEN:  N/A  COUNTS:  YES  TOURNIQUET:   Total Tourniquet Time Documented: Calf (Left) - 49 minutes Total: Calf (Left) - 49 minutes   DICTATION: .Note written in EPIC  PLAN OF CARE: Discharge to home after PACU  PATIENT DISPOSITION:  PACU - hemodynamically stable.   Delay start of Pharmacological VTE agent (>24hrs) due to surgical blood loss or risk of bleeding: no

## 2018-08-11 NOTE — Anesthesia Procedure Notes (Signed)
Anesthesia Regional Block: Popliteal block   Pre-Anesthetic Checklist: ,, timeout performed, Correct Patient, Correct Site, Correct Laterality, Correct Procedure, Correct Position, site marked, Risks and benefits discussed,  Surgical consent,  Pre-op evaluation,  At surgeon's request and post-op pain management  Laterality: Left  Prep: Maximum Sterile Barrier Precautions used, chloraprep       Needles:  Injection technique: Single-shot  Needle Type: Echogenic Stimulator Needle     Needle Length: 9cm  Needle Gauge: 22     Additional Needles:   Procedures:,,,, ultrasound used (permanent image in chart),,,,  Narrative:  Start time: 08/11/2018 12:06 PM End time: 08/11/2018 12:16 PM Injection made incrementally with aspirations every 5 mL.  Performed by: Personally  Anesthesiologist: Elmer Picker, MD  Additional Notes: Monitors applied. No increased pain on injection. No increased resistance to injection. Injection made in 5cc increments. Good needle visualization. Patient tolerated procedure well.

## 2018-08-11 NOTE — H&P (Signed)
H&P: Podiatry   MIW:OEHOZ Brianna Martin 28 y.o. female  patient with a history of Buion pain at baby toe joint seen on several occassions in office complaining of L>R Foot pain; Patient has tried multiple conservative treatments and opted for Surgical management. Patient had pre-op physical and clearance for 5th bunionectomy and hammertoe repair completed. Patient met this AM in pre-op holding area; informed consent reviewed and signed. All questions answered. Left foot/surgical site marked.  This am patient denies any other pedal complaints. Denies Nausea/fever/vomiting/chills/night sweats/overnight events. Confirms NPO since midnight.   Patient Active Problem List   Diagnosis Date Noted  . Abnormal MSAFP (maternal serum alpha-fetoprotein), elevated 11/05/2016  . Psychoactive substance use disorder 11/20/2014    No current facility-administered medications on file prior to encounter.    Current Outpatient Medications on File Prior to Encounter  Medication Sig Dispense Refill  . meloxicam (MOBIC) 15 MG tablet Take 1 tablet (15 mg total) by mouth daily. 30 tablet 0  . metFORMIN (GLUCOPHAGE) 500 MG tablet Take 250 mg by mouth daily with breakfast.       No Known Allergies  Social History   Socioeconomic History  . Marital status: Married    Spouse name: Not on file  . Number of children: Not on file  . Years of education: Not on file  . Highest education level: Not on file  Occupational History  . Not on file  Social Needs  . Financial resource strain: Not on file  . Food insecurity:    Worry: Not on file    Inability: Not on file  . Transportation needs:    Medical: Not on file    Non-medical: Not on file  Tobacco Use  . Smoking status: Former Smoker    Packs/day: 0.50    Years: 2.00    Pack years: 1.00    Types: Cigarettes    Last attempt to quit: 06/21/2016    Years since quitting: 2.1  . Smokeless tobacco: Never Used  . Tobacco comment: no cigs, pt vapes now  since 05/2016  Substance and Sexual Activity  . Alcohol use: No  . Drug use: No  . Sexual activity: Yes    Birth control/protection: None  Lifestyle  . Physical activity:    Days per week: Not on file    Minutes per session: Not on file  . Stress: Not on file  Relationships  . Social connections:    Talks on phone: Not on file    Gets together: Not on file    Attends religious service: Not on file    Active member of club or organization: Not on file    Attends meetings of clubs or organizations: Not on file    Relationship status: Not on file  . Intimate partner violence:    Fear of current or ex partner: Not on file    Emotionally abused: Not on file    Physically abused: Not on file    Forced sexual activity: Not on file  Other Topics Concern  . Not on file  Social History Narrative  . Not on file    Past Surgical History:  Procedure Laterality Date  . ADENOIDECTOMY    . CESAREAN SECTION    . DILATION AND CURETTAGE OF UTERUS    . EXPLORATORY LAPAROTOMY    . TONSILLECTOMY    . WISDOM TOOTH EXTRACTION      Family History  Problem Relation Age of Onset  . Urticaria Neg Hx   .  Immunodeficiency Neg Hx   . Eczema Neg Hx   . Atopy Neg Hx   . Asthma Neg Hx   . Angioedema Neg Hx   . Allergic rhinitis Neg Hx      REVIEW OF SYSTEMS: Neurologic: Denies vertigo, syncope, convulsions, + headache. Musculoskeletal: No muscle or joint pain. Cardiorespiratory: Denies shortness of breath, dyspnea on exertion, chest pain, cough or hemoptysis. Gastrointestinal: Denies loose stool, Denies emesis, melena, constipationor rectal  bleeding. Genitourinary: No difficulty with voiding noted.  PHYSICAL EXAMINATION:  Today's Vitals   08/01/18 1225 08/11/18 1134  BP:  101/70  Pulse:  73  Resp:  15  Temp:  98.3 F (36.8 C)  TempSrc:  Oral  SpO2:  100%  Weight: 70.3 kg 72.2 kg  Height: _0  (1.575 m) _1  (1.575 m)  PainSc:  3     GENERAL: Well-developed,  well-nourished, in no acute distress. Alert  and cooperative.   LOWER EXTREMITY EXAM: DERMATOLOGY: Skin warm and supple bilateral, no open lesions, nails within normal limits. VASCULAR: Dorsalis Pedis 2/4 and Posterior Tibial 2/4 pedal pulses bilateral, Temperature gradient within normal limits, Capillary fill time 3 seconds, positive pedal hair growth present bilateral. NEUROLOGY: Gross sensation present with light touch bilateral MUSCULOSKELETAL: +foot deformity/ pain with palpation to area of concernat Left>Right 5th MTPJ    XRAY, RIGHT/LEFT FOOT- SEE CHART   ASSESSMENTS:  1.Left tailors bunion 2. Left hammertoe 3. Left 5th MTPJ capsulitis   PLAN OF CARE: Patient seen and evaluated 1. History and physical completed 2. Patient NPO since midnight 3. Previous Imaging reviewed  4. Consent for surgery explained and obtained for Left tailors bunionectomy w/ 5th hammertoe repair; risk and benefits explained; all questions answered and no guarantees granted. 5. Patient to undergo above surgical procedure 6. Case discussed with patient and to meet with family represented after the procedure 7. To resume all home meds post-procedure and to give prn pain meds, anti-nausea, and anti-constipation medications post op 8. Will continue to follow closely/ see post-operative in the office within 1 week.  Landis Martins, DPM

## 2018-08-11 NOTE — Discharge Instructions (Signed)
Regional Anesthesia Blocks ? ?1. Numbness or the inability to move the "blocked" extremity may last from 3-48 hours after placement. The length of time depends on the medication injected and your individual response to the medication. If the numbness is not going away after 48 hours, call your surgeon. ? ?2. The extremity that is blocked will need to be protected until the numbness is gone and the  Strength has returned. Because you cannot feel it, you will need to take extra care to avoid injury. Because it may be weak, you may have difficulty moving it or using it. You may not know what position it is in without looking at it while the block is in effect. ? ?3. For blocks in the legs and feet, returning to weight bearing and walking needs to be done carefully. You will need to wait until the numbness is entirely gone and the strength has returned. You should be able to move your leg and foot normally before you try and bear weight or walk. You will need someone to be with you when you first try to ensure you do not fall and possibly risk injury. ? ?4. Bruising and tenderness at the needle site are common side effects and will resolve in a few days. ? ?5. Persistent numbness or new problems with movement should be communicated to the surgeon or the [facility] ([phone])/ [facility] ([phone]).  ? ?Post Anesthesia Home Care Instructions ? ?Activity: ?Get plenty of rest for the remainder of the day. A responsible individual must stay with you for 24 hours following the procedure.  ?For the next 24 hours, DO NOT: ?-Drive a car ?-Operate machinery ?-Drink alcoholic beverages ?-Take any medication unless instructed by your physician ?-Make any legal decisions or sign important papers. ? ?Meals: ?Start with liquid foods such as gelatin or soup. Progress to regular foods as tolerated. Avoid greasy, spicy, heavy foods. If nausea and/or vomiting occur, drink only clear liquids until the  nausea and/or vomiting subsides. Call your physician if vomiting continues. ? ?Special Instructions/Symptoms: ?Your throat may feel dry or sore from the anesthesia or the breathing tube placed in your throat during surgery. If this causes discomfort, gargle with warm salt water. The discomfort should disappear within 24 hours. ? ?If you had a scopolamine patch placed behind your ear for the management of post- operative nausea and/or vomiting: ? ?1. The medication in the patch is effective for 72 hours, after which it should be removed.  Wrap patch in a tissue and discard in the trash. Wash hands thoroughly with soap and water. ?2. You may remove the patch earlier than 72 hours if you experience unpleasant side effects which may include dry mouth, dizziness or visual disturbances. ?3. Avoid touching the patch. Wash your hands with soap and water after contact with the patch. ?    ?

## 2018-08-11 NOTE — Brief Op Note (Signed)
08/11/2018  1:42 PM  PATIENT:  Brianna Martin  28 y.o. female  PRE-OPERATIVE DIAGNOSIS:  hammertoe and Taylor's bunion left foot  POST-OPERATIVE DIAGNOSIS:  hammertoe and Taylor's bunion left foot  PROCEDURE:  Procedure(s): TAYLOR'S BUNIONECTOMY LEFT FOOT (Left) 5TH HAMMER TOE CORRECTION LEFT FOOT (Left)  SURGEON:  Surgeon(s) and Role:    * Asencion Islam, DPM - Primary  PHYSICIAN ASSISTANT: None  ASSISTANTS: none   ANESTHESIA:   regional  EBL:  Minimal   BLOOD ADMINISTERED:none  DRAINS: none   LOCAL MEDICATIONS USED:  NONE  SPECIMEN:  No Specimen  DISPOSITION OF SPECIMEN:  N/A  COUNTS:  YES  TOURNIQUET:   Total Tourniquet Time Documented: Calf (Left) - 49 minutes Total: Calf (Left) - 49 minutes   DICTATION: .Note written in EPIC  PLAN OF CARE: Discharge to home after PACU  PATIENT DISPOSITION:  PACU - hemodynamically stable.   Delay start of Pharmacological VTE agent (>24hrs) due to surgical blood loss or risk of bleeding: no

## 2018-08-11 NOTE — Progress Notes (Signed)
Assisted D. Woodrum with left, ultrasound guided, popliteal block. Side rails up, monitors on throughout procedure. See vital signs in flow sheet. Tolerated Procedure well. 

## 2018-08-11 NOTE — Transfer of Care (Signed)
Immediate Anesthesia Transfer of Care Note  Patient: Brianna Martin  Procedure(s) Performed: Pauline Good LEFT FOOT (Left ) 5TH HAMMER TOE CORRECTION LEFT FOOT (Left )  Patient Location: PACU  Anesthesia Type:MAC combined with regional for post-op pain  Level of Consciousness: awake, alert  and oriented  Airway & Oxygen Therapy: Patient Spontanous Breathing and Patient connected to face mask oxygen  Post-op Assessment: Report given to RN and Post -op Vital signs reviewed and stable  Post vital signs: Reviewed and stable  Last Vitals:  Vitals Value Taken Time  BP 92/66 08/11/2018  1:40 PM  Temp    Pulse 64 08/11/2018  1:42 PM  Resp 14 08/11/2018  1:42 PM  SpO2 100 % 08/11/2018  1:42 PM  Vitals shown include unvalidated device data.  Last Pain:  Vitals:   08/11/18 1134  TempSrc: Oral  PainSc: 3       Patients Stated Pain Goal: 3 (28/31/51 7616)  Complications: No apparent anesthesia complications

## 2018-08-11 NOTE — Op Note (Signed)
DATE OF OPERATION:   PREOPERATIVE DIAGNOSES:  1. Taliors bunion Left 2. Hammertoe 5th left 3. Left foot pain   POSTOPERATIVE DIAGNOSIS:  Same  OPERATION PERFORMED:  1.  Tailors bunionectomy Left with hammertoe repair    SURGEON: Asencion Islam, DPM   ASSISTANT: None  ESTIMATED BLOOD LOSS:  Minimal.   HEMOSTASIS:  Ankle tourniquet   INJECTABLES:  None, Regional block by anesthesia   INDICATIONS FOR OPERATION:  The patient is a 28 y.o. @GENDER @ with clinical and radiographic signs and symptoms consistent with the above-stated diagnosis.The patient has chosen surgical correction of the diagnosis and has consented for surgery. All risks, benefits and complications have been explained to the patient including but not limited to recurrence of the deformity, dehiscence of incision site and the need for further surgery. The patient understands. No guarantees were made or implied to the outcome of the surgery.   DESCRIPTION OF OPERATION:  The patient was brought into the operating room and positioned in OR table in the supine position. Following appropriate padding of all bony areas, tourniquet was placed at left ankle but not yet inflated. Following the foot was then prepped and draped.   Attention was directed towards the left 5th MTPJ where anesthesia was confirmed and then using a 15 blade a dorsal longitudinal incision was made centered over the interphalangeal joint of 5th toe on the left foot.  The incision was deepened through skin and subcutaneous tissue with care to protect and retract all vital neurovascular structures.  Once down to the level of the joint capsule incision was extended to 5th metararsal shaft. The extensor tendon was identified and a transverse tenotomy was made freeing up the joint at the metatarsal and interphalangeal joint and then exposing the head of metatarsal and 5th proximal phalanx in a stepwise fashion then utilizing a sagittal saw the head of each  proximal phalanx was then resected and passed off to the back table and the metatarsal head with cut in a V shape fashion and IM reduced by translocating distal fragment medially and stabilized with Kwires. Using A-O technique a 16 and 14 mm length was placed to close down the osteotomy site.  Then utilizing a rasp overhanging bone was rasped smooth.  Following the area was then copiously flushed and then  the capsular tissue over the incision areas were coapted using 3-0 Vicryl, subcutaneous tissue coap ted using 4-0 moncyrl.  The skin was re-coapted utilizing 5-0 nylon in a simple interrupted suture fashion. A dry sterile dressing was applied and the tourniquet was rapidly deflated and prompt instantaneous hyperemic response was noted to all aspects of the patient's left lower extremity with digital capillary refill time measuring less than 3 seconds to digits 1-5 of the left foot.  The patient tolerated the procedure and local anesthesia well and was transported from the operating room to the recovery with vital signs stable and vascular status intact to all aspects of the patient's left lower extremity.  Following a period of postoperative monitoring, the patient was discharged home with oral and written instructions per Dr. Marylene Land. The patient was given postop prescriptions. The patient will remain partial weightbearing with wedge shoe however advised to limit activity because she has internal hardware and was informed to keep dressing clean dry and intact and to follow up in office in 1 week for continued post op care.  Asencion Islam, DPM

## 2018-08-12 ENCOUNTER — Telehealth: Payer: Self-pay | Admitting: Sports Medicine

## 2018-08-12 ENCOUNTER — Telehealth (INDEPENDENT_AMBULATORY_CARE_PROVIDER_SITE_OTHER): Payer: Self-pay | Admitting: Orthopedic Surgery

## 2018-08-12 ENCOUNTER — Encounter (HOSPITAL_BASED_OUTPATIENT_CLINIC_OR_DEPARTMENT_OTHER): Payer: Self-pay | Admitting: Sports Medicine

## 2018-08-12 NOTE — Telephone Encounter (Signed)
Patient called and wanted MRI results. Stated had surgery on foot 08/11/18 and unable to drive and wanted to know if someone could call her to go over results over phone.  Please call patient to advise.  [phone]

## 2018-08-12 NOTE — Anesthesia Postprocedure Evaluation (Signed)
Anesthesia Post Note  Patient: Brianna Martin  Procedure(s) Performed: Pauline Good LEFT FOOT (Left ) 5TH HAMMER TOE CORRECTION LEFT FOOT (Left )     Patient location during evaluation: PACU Anesthesia Type: Regional and MAC Level of consciousness: awake and alert Pain management: pain level controlled Vital Signs Assessment: post-procedure vital signs reviewed and stable Respiratory status: spontaneous breathing, nonlabored ventilation, respiratory function stable and patient connected to nasal cannula oxygen Cardiovascular status: stable and blood pressure returned to baseline Postop Assessment: no apparent nausea or vomiting Anesthetic complications: no    Last Vitals:  Vitals:   08/11/18 1400 08/11/18 1419  BP: (!) 88/62 (!) 103/58  Pulse: 65 61  Resp: 11   Temp:  36.5 C  SpO2: 100% 100%    Last Pain:  Vitals:   08/11/18 1134  TempSrc: Oral                 Jamonte Curfman L Cinnamon Morency

## 2018-08-12 NOTE — Telephone Encounter (Signed)
Post op check phone call was made to patient. Patient reports that she thinks her block is starting to wear off. I advised patient to continue with her pain meds as Rx to avoid excuricating pain as the block is wearing off. Patient also asked about the position of the screws and the inflamed tissue/lump that was present. I explained in detailed the purpose and how I addressed this surgically. Patient expressed understanding. I reminded patient to continue with rest, ice, elevation, and use of forefoot offloading shoe and crutches PRN. Patient to follow up as scheduled for continued post op care. Advised patient to call office if any other concerns or questions. -Dr. Marylene Land

## 2018-08-13 ENCOUNTER — Encounter: Payer: Self-pay | Admitting: Sports Medicine

## 2018-08-13 NOTE — Telephone Encounter (Signed)
Left message informing pt I would also leave a message on MyChart. Left message informing pt, it did sound as if the dressing was too tight, to remove the surgery boot, open-ended sock, stretch ace wrap without removing the gauze dressing, elevated the surgery foot for 15 minutes, but if discomfort increases dangle for 15 minutes, either way after the 15 minutes place surgery foot level with the hip, then beginning at the toes loosely wrap the ace single layer up the foot and leg, reapply the sock and boot. I also instructed that she must be in the boot at all time, not have the foot below her heart or weight bearing more than 15 minutes/hour, ice 15 minutes/per hour, and to call with concerns.

## 2018-08-13 NOTE — Telephone Encounter (Signed)
Please advise. Thanks.  

## 2018-08-14 ENCOUNTER — Telehealth: Payer: Self-pay | Admitting: *Deleted

## 2018-08-14 ENCOUNTER — Other Ambulatory Visit: Payer: Self-pay | Admitting: Sports Medicine

## 2018-08-14 MED ORDER — HYDROMORPHONE HCL 4 MG PO TABS: 4.0000 mg | ORAL_TABLET | Freq: Four times a day (QID) | ORAL | 0 refills | Status: DC | PRN

## 2018-08-14 MED ORDER — HYDROMORPHONE HCL 4 MG PO TABS: 4.0000 mg | ORAL_TABLET | Freq: Four times a day (QID) | ORAL | 0 refills | Status: AC | PRN

## 2018-08-14 MED FILL — HYDROmorphone HCL 4 MG TABS: 4 | 5 days supply | Qty: 20 | Fill #0

## 2018-08-14 NOTE — Telephone Encounter (Signed)
I called left message on machine

## 2018-08-14 NOTE — Telephone Encounter (Signed)
I informed pt of the change to the [facility].

## 2018-08-14 NOTE — Telephone Encounter (Signed)
Left message informing pt I was calling to get more information to give DR. Stover concerning her pain and I would send her request to Dr. Marylene Land, and for pt to call with concerns.

## 2018-08-14 NOTE — Progress Notes (Signed)
Changed Norco to Dilaudid and sent to [facility] [place]. Changed location to [facility] -Dr Kathie Rhodes

## 2018-08-14 NOTE — Telephone Encounter (Signed)
Thanks

## 2018-08-14 NOTE — Telephone Encounter (Signed)
Pt called states she is at [facility] and the dilaudid has not been called to the pharmacy. I asked pt to describe the pain and she states it is on the top to the little toe and along the bottom of the foot, burning and throbbing. I told pt, that was typical surgery site pain, and on occasion if she gently tug the gauze at the tender site and sometimes loosing the gauze may help with the sensations she is feeling. Pt states understanding. I called Dr. Marylene Land and she states she thought [facility] was to [facility] [address], but would change to the [facility].

## 2018-08-14 NOTE — Telephone Encounter (Signed)
We need to find out what type of reaction is she having to the pain medication or why she wants a change in the medication? Also be sure that she is taking her Motrin in between doses and if her dressing feels tight to loosen it too to help with pain -Dr. Marylene Land

## 2018-08-20 ENCOUNTER — Ambulatory Visit (INDEPENDENT_AMBULATORY_CARE_PROVIDER_SITE_OTHER): Payer: 59 | Admitting: Sports Medicine

## 2018-08-20 ENCOUNTER — Ambulatory Visit (INDEPENDENT_AMBULATORY_CARE_PROVIDER_SITE_OTHER): Payer: 59

## 2018-08-20 ENCOUNTER — Encounter: Payer: Self-pay | Admitting: *Deleted

## 2018-08-20 ENCOUNTER — Encounter: Payer: Self-pay | Admitting: Sports Medicine

## 2018-08-20 VITALS — BP 109/68 | HR 85 | Temp 97.4°F | Resp 16

## 2018-08-20 DIAGNOSIS — M21622 Bunionette of left foot: Secondary | ICD-10-CM

## 2018-08-20 DIAGNOSIS — Z9889 Other specified postprocedural states: Secondary | ICD-10-CM

## 2018-08-20 DIAGNOSIS — M779 Enthesopathy, unspecified: Secondary | ICD-10-CM

## 2018-08-20 DIAGNOSIS — M2040 Other hammer toe(s) (acquired), unspecified foot: Secondary | ICD-10-CM

## 2018-08-20 MED ORDER — HYDROCODONE-ACETAMINOPHEN 10-325 MG PO TABS: 1.0000 | ORAL_TABLET | Freq: Four times a day (QID) | ORAL | 0 refills | Status: AC | PRN

## 2018-08-20 NOTE — Progress Notes (Signed)
Subjective: Brianna Martin is a 28 y.o. female patient seen today in office for POV #1 (DOS 08-11-18), S/P Left tailors bunionectomy with 5th HT repair. Patient admits pain at surgical site 7/10 at night and now back at work on desk duty and requires motrin during the day. Reports the 1st 4-5 days was the worst, denies calf pain, denies headache, chest pain, shortness of breath, nausea, vomiting, fever, or chills. No other issues noted.   Patient Active Problem List   Diagnosis Date Noted  . Abnormal MSAFP (maternal serum alpha-fetoprotein), elevated 11/05/2016  . Psychoactive substance use disorder 11/20/2014    Current Outpatient Medications on File Prior to Visit  Medication Sig Dispense Refill  . docusate sodium (COLACE) 100 MG capsule Take 1 capsule (100 mg total) by mouth daily as needed. 30 capsule 2  . escitalopram (LEXAPRO) 10 MG tablet     . ibuprofen (ADVIL,MOTRIN) 800 MG tablet Take 1 tablet (800 mg total) by mouth every 8 (eight) hours as needed. 30 tablet 0  . LORazepam (ATIVAN) 0.5 MG tablet     . metFORMIN (GLUCOPHAGE) 500 MG tablet Take 250 mg by mouth daily with breakfast.    . promethazine (PHENERGAN) 25 MG tablet Take 1 tablet (25 mg total) by mouth every 6 (six) hours as needed for nausea or vomiting. 30 tablet 0   No current facility-administered medications on file prior to visit.     No Known Allergies  Objective: There were no vitals filed for this visit.  General: No acute distress, AAOx3  Left foot: Sutures intact with no gapping or dehiscence at surgical site, mild swelling to left 5th MTPJ, faint erythema, no warmth, no drainage, no acute signs of infection noted, Capillary fill time <3 seconds in all digits, gross sensation present via light touch to left foot. + pain or crepitation with range of motion left foot and burning sensation to area.  No pain with calf compression.   Post Op Xray, Left foot: 5th met in excellent alignment and position. Osteotomy  site healing. Hardware intact. Soft tissue swelling within normal limits for post op status.   Assessment and Plan:  Problem List Items Addressed This Visit    None    Visit Diagnoses    Post-operative state    -  Primary   Relevant Medications   HYDROcodone-acetaminophen (NORCO) 10-325 MG tablet   Other Relevant Orders   DG Foot Complete Left   Capsulitis       Relevant Orders   DG Foot Complete Left   Tailor's bunion of left foot       Relevant Orders   DG Foot Complete Left   Hammer toe, unspecified laterality       Relevant Orders   DG Foot Complete Left       -Patient seen and evaluated -Applied dry sterile dressing to surgical site right/left foot secured with ACE wrap and stockinet  -Advised patient to make sure to keep dressings clean, dry, and intact to left surgical site, removing the ACE as needed  -Advised patient to continue with motrin during day and Norco at night for mild to moderate pain and Dilauadid for severe pain of which she already has -Advised patient to continue with post-op shoe on left and crutch -Advised patient to limit activity to necessity  -Advised patient to ice and elevate as necessary  -Will plan for possible suture removal at next office visit. In the meantime, patient to call office if any issues or  problems arise.   Landis Martins, DPM

## 2018-08-28 ENCOUNTER — Encounter: Payer: Self-pay | Admitting: *Deleted

## 2018-08-28 ENCOUNTER — Encounter: Payer: Self-pay | Admitting: Sports Medicine

## 2018-08-28 ENCOUNTER — Ambulatory Visit (INDEPENDENT_AMBULATORY_CARE_PROVIDER_SITE_OTHER): Payer: 59 | Admitting: Sports Medicine

## 2018-08-28 VITALS — BP 122/70 | HR 68 | Temp 96.2°F | Resp 16

## 2018-08-28 DIAGNOSIS — M2040 Other hammer toe(s) (acquired), unspecified foot: Secondary | ICD-10-CM

## 2018-08-28 DIAGNOSIS — Z9889 Other specified postprocedural states: Secondary | ICD-10-CM

## 2018-08-28 DIAGNOSIS — M21622 Bunionette of left foot: Secondary | ICD-10-CM

## 2018-08-28 DIAGNOSIS — M779 Enthesopathy, unspecified: Secondary | ICD-10-CM

## 2018-08-28 MED ORDER — HYDROCODONE-ACETAMINOPHEN 10-325 MG PO TABS: 1.0000 | ORAL_TABLET | Freq: Four times a day (QID) | ORAL | 0 refills | Status: AC | PRN

## 2018-08-28 NOTE — Progress Notes (Signed)
Subjective: Tanisa Toni is a 28 y.o. female patient seen today in office for POV #2 (DOS 08-11-18), S/P Left tailors bunionectomy with 5th HT repair. Patient admits pain at surgical site 7/10 with some burning at the top that is off and on and states that she has been forgetting to ice but has tried to elevate when she is sitting and is still taking her pain medicine to help at the end of the day after work. Patient denies headache, chest pain, shortness of breath, nausea, vomiting, fever, or chills. No other issues noted.   Patient Active Problem List   Diagnosis Date Noted  . Abnormal MSAFP (maternal serum alpha-fetoprotein), elevated 11/05/2016  . Psychoactive substance use disorder 11/20/2014    Current Outpatient Medications on File Prior to Visit  Medication Sig Dispense Refill  . docusate sodium (COLACE) 100 MG capsule Take 1 capsule (100 mg total) by mouth daily as needed. 30 capsule 2  . escitalopram (LEXAPRO) 10 MG tablet     . ibuprofen (ADVIL,MOTRIN) 800 MG tablet Take 1 tablet (800 mg total) by mouth every 8 (eight) hours as needed. 30 tablet 0  . LORazepam (ATIVAN) 0.5 MG tablet     . metFORMIN (GLUCOPHAGE) 500 MG tablet Take 250 mg by mouth daily with breakfast.    . promethazine (PHENERGAN) 25 MG tablet Take 1 tablet (25 mg total) by mouth every 6 (six) hours as needed for nausea or vomiting. 30 tablet 0   No current facility-administered medications on file prior to visit.     No Known Allergies  Objective: There were no vitals filed for this visit.  General: No acute distress, AAOx3  Left foot: Sutures intact with minimal gapping when the skin edges was pulled at surgical site, mild to moderate swelling to left 5th MTPJ, faint erythema, no warmth, no drainage, no acute signs of infection noted, Capillary fill time <3 seconds in all digits, gross sensation present via light touch to left foot. + pain or crepitation with range of motion left foot and burning sensation  to area.  No pain with calf compression.    Assessment and Plan:  Problem List Items Addressed This Visit    None    Visit Diagnoses    Tailor's bunion of left foot    -  Primary   Relevant Medications   HYDROcodone-acetaminophen (NORCO) 10-325 MG tablet   Capsulitis       Relevant Medications   HYDROcodone-acetaminophen (NORCO) 10-325 MG tablet   Hammer toe, unspecified laterality       Relevant Medications   HYDROcodone-acetaminophen (NORCO) 10-325 MG tablet   Post-operative state       Relevant Medications   HYDROcodone-acetaminophen (NORCO) 10-325 MG tablet       -Patient seen and evaluated -Discussed with patient that we will leave sutures in because of the swelling and the mild amount of gapping when the skin edges are tucked -Applied dry sterile dressing to surgical site left foot secured with coban wrap and stockinet  -Advised patient to make sure to keep dressings clean, dry, and intact to left surgical site -Advised patient to continue with motrin during day and Norco at night for mild to moderate pain and Dilauadid for severe pain of which she already has; refilled Norco -Advised patient to continue with post-op shoe on left and may wean from crutch -Advised patient to limit activity to necessity  -Advised patient to ice at least once daily at the end of the day due to  inflammation and swelling that is still present at surgical site and continue with constant elevation and desk duty at work -Will plan for suture removal at next office visit. In the meantime, patient to call office if any issues or problems arise.   Asencion Islam, DPM

## 2018-09-01 ENCOUNTER — Encounter: Payer: Self-pay | Admitting: Sports Medicine

## 2018-09-01 NOTE — Telephone Encounter (Signed)
Left message informing pt I would send her request to the doctor-on-call, but she should continue the hydrocodone ordered on 08/28/2018, back off on her activity, continue the ice and wear the boot anytime she was up walking.

## 2018-09-02 ENCOUNTER — Other Ambulatory Visit: Payer: Self-pay | Admitting: Podiatry

## 2018-09-02 MED ORDER — HYDROMORPHONE HCL 2 MG PO TABS: 1.0000 mg | ORAL_TABLET | ORAL | 0 refills | Status: DC | PRN

## 2018-09-02 NOTE — Progress Notes (Unsigned)
Rx refilled.

## 2018-09-03 DIAGNOSIS — J111 Influenza due to unidentified influenza virus with other respiratory manifestations: Secondary | ICD-10-CM | POA: Diagnosis not present

## 2018-09-03 DIAGNOSIS — R509 Fever, unspecified: Secondary | ICD-10-CM | POA: Diagnosis not present

## 2018-09-05 ENCOUNTER — Encounter: Payer: Self-pay | Admitting: Sports Medicine

## 2018-09-05 ENCOUNTER — Ambulatory Visit (INDEPENDENT_AMBULATORY_CARE_PROVIDER_SITE_OTHER): Payer: 59 | Admitting: Sports Medicine

## 2018-09-05 ENCOUNTER — Other Ambulatory Visit: Payer: Self-pay

## 2018-09-05 VITALS — BP 123/85 | HR 79 | Temp 96.7°F | Resp 16

## 2018-09-05 DIAGNOSIS — M2040 Other hammer toe(s) (acquired), unspecified foot: Secondary | ICD-10-CM

## 2018-09-05 DIAGNOSIS — M779 Enthesopathy, unspecified: Secondary | ICD-10-CM

## 2018-09-05 DIAGNOSIS — Z9889 Other specified postprocedural states: Secondary | ICD-10-CM

## 2018-09-05 DIAGNOSIS — M21622 Bunionette of left foot: Secondary | ICD-10-CM

## 2018-09-05 NOTE — Progress Notes (Signed)
Subjective: Brianna Martin is a 28 y.o. female patient seen today in office for POV #3 (DOS 08-11-18), S/P Left tailors bunionectomy with 5th HT repair. Patient admits pain at surgical site that is slowly getting better reports that pain now is 6 out of 10 and a little swollen states that she has been wearing her surgical shoe and taking her Norco reports that she did require Dilaudid because she was recently diagnosed with flu and had significant body aches and nausea but reports that she is doing much better now but still is a little sore from the body aches and is wearing a mask at today's visit. No other issues noted.   Patient Active Problem List   Diagnosis Date Noted  . Abnormal MSAFP (maternal serum alpha-fetoprotein), elevated 11/05/2016  . Psychoactive substance use disorder 11/20/2014    Current Outpatient Medications on File Prior to Visit  Medication Sig Dispense Refill  . docusate sodium (COLACE) 100 MG capsule Take 1 capsule (100 mg total) by mouth daily as needed. 30 capsule 2  . escitalopram (LEXAPRO) 10 MG tablet     . HYDROmorphone (DILAUDID) 2 MG tablet Take 0.5 tablets (1 mg total) by mouth every 4 (four) hours as needed for severe pain. 20 tablet 0  . ibuprofen (ADVIL,MOTRIN) 800 MG tablet Take 1 tablet (800 mg total) by mouth every 8 (eight) hours as needed. 30 tablet 0  . LORazepam (ATIVAN) 0.5 MG tablet     . metFORMIN (GLUCOPHAGE) 500 MG tablet Take 250 mg by mouth daily with breakfast.    . promethazine (PHENERGAN) 25 MG tablet Take 1 tablet (25 mg total) by mouth every 6 (six) hours as needed for nausea or vomiting. 30 tablet 0   No current facility-administered medications on file prior to visit.     No Known Allergies  Objective: There were no vitals filed for this visit.  General: No acute distress, AAOx3  Left foot: Sutures intact with no gapping when the skin edges was pulled at surgical site, mild to moderate swelling to left 5th MTPJ, faint erythema,  no warmth, no drainage, no acute signs of infection noted, Capillary fill time <3 seconds in all digits, gross sensation present via light touch to left foot. + pain or crepitation with range of motion left foot and burning sensation to area.  No pain with calf compression.    Assessment and Plan:  Problem List Items Addressed This Visit    None    Visit Diagnoses    Tailor's bunion of left foot    -  Primary   Capsulitis       Hammer toe, unspecified laterality       Post-operative state           -Patient seen and evaluated -Sutures removed -Applied dry sterile dressing to surgical site left foot secured with coban wrap and stockinet advised patient to keep dressing clean dry and intact for 1 week on next week may remove and shower and allow Steri-Strips to fall off on their own -Advised patient to continue with motrin during day and Norco at night for mild to moderate pain and Dilauadid for severe pain of which she already has already -Advised patient to continue with post-op shoe on left using the wedge and then may transition to flat shoe and next visit we will trade out the postop shoes -Advised patient to limit activity to necessity  -Advised patient to continue with icing rest and elevation -Will plan for x-ray  and possible transition out of postop shoe at next office visit. In the meantime, patient to call office if any issues or problems arise.   Asencion Islam, DPM

## 2018-09-08 ENCOUNTER — Telehealth: Payer: Self-pay | Admitting: *Deleted

## 2018-09-08 NOTE — Telephone Encounter (Signed)
Patient called requesting a refill of he pain med.

## 2018-09-09 ENCOUNTER — Other Ambulatory Visit: Payer: Self-pay | Admitting: Sports Medicine

## 2018-09-09 DIAGNOSIS — Z9889 Other specified postprocedural states: Secondary | ICD-10-CM

## 2018-09-09 MED ORDER — HYDROCODONE-ACETAMINOPHEN 10-325 MG PO TABS: 1.0000 | ORAL_TABLET | Freq: Four times a day (QID) | ORAL | 0 refills | Status: DC | PRN

## 2018-09-09 NOTE — Progress Notes (Signed)
Refilled Norco -Dr. Shakerra Red 

## 2018-09-10 DIAGNOSIS — Z760 Encounter for issue of repeat prescription: Secondary | ICD-10-CM | POA: Diagnosis not present

## 2018-09-16 ENCOUNTER — Other Ambulatory Visit: Payer: Self-pay | Admitting: Sports Medicine

## 2018-09-16 DIAGNOSIS — Z9889 Other specified postprocedural states: Secondary | ICD-10-CM

## 2018-09-16 MED ORDER — IBUPROFEN 800 MG PO TABS: 800.0000 mg | ORAL_TABLET | Freq: Three times a day (TID) | ORAL | 0 refills | Status: DC | PRN

## 2018-09-16 MED ORDER — HYDROCODONE-ACETAMINOPHEN 10-325 MG PO TABS: 1.0000 | ORAL_TABLET | Freq: Four times a day (QID) | ORAL | 0 refills | Status: DC | PRN

## 2018-09-19 ENCOUNTER — Ambulatory Visit (INDEPENDENT_AMBULATORY_CARE_PROVIDER_SITE_OTHER): Payer: 59 | Admitting: Sports Medicine

## 2018-09-19 ENCOUNTER — Other Ambulatory Visit: Payer: Self-pay

## 2018-09-19 ENCOUNTER — Encounter: Payer: Self-pay | Admitting: Sports Medicine

## 2018-09-19 ENCOUNTER — Ambulatory Visit (INDEPENDENT_AMBULATORY_CARE_PROVIDER_SITE_OTHER): Payer: 59

## 2018-09-19 VITALS — BP 130/85 | HR 79 | Temp 96.2°F | Resp 16

## 2018-09-19 DIAGNOSIS — M779 Enthesopathy, unspecified: Secondary | ICD-10-CM | POA: Diagnosis not present

## 2018-09-19 DIAGNOSIS — M2040 Other hammer toe(s) (acquired), unspecified foot: Secondary | ICD-10-CM

## 2018-09-19 DIAGNOSIS — M21622 Bunionette of left foot: Secondary | ICD-10-CM

## 2018-09-19 DIAGNOSIS — Z9889 Other specified postprocedural states: Secondary | ICD-10-CM

## 2018-09-19 NOTE — Progress Notes (Signed)
Subjective: Brianna Martin is a 28 y.o. female patient seen today in office for POV #4 (DOS 08-11-18), S/P Left tailors bunionectomy with 5th HT repair. Patient admits pain at surgical site that is slowly getting better reports that pain now is 5 out of 10 and still the same amount of swelling with pain off and on pain worse at the end of the day states that she has been taking her Norco icing and elevating with improvement and has transitioned to a flat postop shoe with no problems.  Denies nausea vomiting fever chills.  Patient is wearing a mask today because concern exposure to a COVID-19 patient. No other issues noted.   Patient Active Problem List   Diagnosis Date Noted  . Abnormal MSAFP (maternal serum alpha-fetoprotein), elevated 11/05/2016  . Psychoactive substance use disorder 11/20/2014    Current Outpatient Medications on File Prior to Visit  Medication Sig Dispense Refill  . docusate sodium (COLACE) 100 MG capsule Take 1 capsule (100 mg total) by mouth daily as needed. 30 capsule 2  . escitalopram (LEXAPRO) 10 MG tablet     . HYDROcodone-acetaminophen (NORCO) 10-325 MG tablet Take 1 tablet by mouth every 6 (six) hours as needed for up to 7 days. 28 tablet 0  . HYDROmorphone (DILAUDID) 2 MG tablet Take 0.5 tablets (1 mg total) by mouth every 4 (four) hours as needed for severe pain. 20 tablet 0  . ibuprofen (ADVIL,MOTRIN) 800 MG tablet Take 1 tablet (800 mg total) by mouth every 8 (eight) hours as needed. 30 tablet 0  . LORazepam (ATIVAN) 0.5 MG tablet     . metFORMIN (GLUCOPHAGE) 500 MG tablet Take 250 mg by mouth daily with breakfast.    . promethazine (PHENERGAN) 25 MG tablet Take 1 tablet (25 mg total) by mouth every 6 (six) hours as needed for nausea or vomiting. 30 tablet 0   No current facility-administered medications on file prior to visit.     No Known Allergies  Objective: There were no vitals filed for this visit.  General: No acute distress, AAOx3  Left foot:  Incision well-healed with mild scabbing at the proximal and distal end of the incision, mild to moderate swelling to left 5th MTPJ, faint erythema, no warmth, no drainage, no acute signs of infection noted, Capillary fill time <3 seconds in all digits, gross sensation present via light touch to left foot. + pain or crepitation with range of motion left foot and burning sensation to area.  No pain with calf compression.   X-ray left foot hardware intact normal postoperative findings and mild soft tissue swelling  Assessment and Plan:  Problem List Items Addressed This Visit    None    Visit Diagnoses    Post-operative state    -  Primary   Relevant Orders   DG Foot Complete Left   Tailor's bunion of left foot       Relevant Orders   DG Foot Complete Left   Hammer toe, unspecified laterality       Relevant Orders   DG Foot Complete Left   Capsulitis           -Patient seen and evaluated -X-rays reviewed -May use scar cream or gel along the incision -Advised patient on how to properly splint the fifth toe using Coban to allow for some compression to assist with edema control -Advised patient to continue with post-op shoe and may slowly transition to tennis shoe as swelling allows -Advised patient to limit  activity to tolerance -Advised patient to continue with icing rest and elevation and Norco as needed at the end of day so that it does not interfere with her work -Will plan for x-ray and discontinuing Coban splint and increasing activities at next office visit. In the meantime, patient to call office if any issues or problems arise.   Asencion Islam, DPM

## 2018-09-23 ENCOUNTER — Other Ambulatory Visit: Payer: Self-pay | Admitting: Sports Medicine

## 2018-09-23 DIAGNOSIS — Z9889 Other specified postprocedural states: Secondary | ICD-10-CM

## 2018-09-23 MED ORDER — HYDROCODONE-ACETAMINOPHEN 10-325 MG PO TABS: 1.0000 | ORAL_TABLET | Freq: Four times a day (QID) | ORAL | 0 refills | Status: DC | PRN

## 2018-09-23 MED ORDER — PROMETHAZINE HCL 25 MG PO TABS: 25.0000 mg | ORAL_TABLET | Freq: Four times a day (QID) | ORAL | 0 refills | Status: DC | PRN

## 2018-09-25 ENCOUNTER — Other Ambulatory Visit: Payer: Self-pay | Admitting: Sports Medicine

## 2018-09-25 DIAGNOSIS — Z9889 Other specified postprocedural states: Secondary | ICD-10-CM

## 2018-09-25 DIAGNOSIS — M21622 Bunionette of left foot: Secondary | ICD-10-CM

## 2018-09-25 DIAGNOSIS — M2040 Other hammer toe(s) (acquired), unspecified foot: Secondary | ICD-10-CM

## 2018-09-25 DIAGNOSIS — M779 Enthesopathy, unspecified: Secondary | ICD-10-CM

## 2018-09-26 ENCOUNTER — Telehealth: Payer: Self-pay | Admitting: Allergy & Immunology

## 2018-09-26 DIAGNOSIS — Z20828 Contact with and (suspected) exposure to other viral communicable diseases: Secondary | ICD-10-CM

## 2018-09-26 DIAGNOSIS — Z20822 Contact with and (suspected) exposure to covid-19: Secondary | ICD-10-CM

## 2018-09-26 NOTE — Addendum Note (Signed)
Addended by: Alfonse Spruce on: 09/26/2018 01:23 PM   Modules accepted: Orders

## 2018-09-26 NOTE — Telephone Encounter (Signed)
Patient had a known COVID exposure via patient approximately one week ago (patient result came back today). COVID testing ordered.   Constance Whittle, MD [facility]   

## 2018-09-29 ENCOUNTER — Other Ambulatory Visit: Payer: Self-pay | Admitting: Sports Medicine

## 2018-09-29 DIAGNOSIS — Z9889 Other specified postprocedural states: Secondary | ICD-10-CM

## 2018-09-29 LAB — NOVEL CORONAVIRUS, NAA
SARS-CoV-2, NAA: NOT DETECTED
SARS-CoV-2, NAA: NOT DETECTED

## 2018-09-30 ENCOUNTER — Other Ambulatory Visit: Payer: Self-pay | Admitting: Sports Medicine

## 2018-09-30 DIAGNOSIS — Z9889 Other specified postprocedural states: Secondary | ICD-10-CM

## 2018-09-30 MED ORDER — HYDROCODONE-ACETAMINOPHEN 10-325 MG PO TABS: 1.0000 | ORAL_TABLET | Freq: Four times a day (QID) | ORAL | 0 refills | Status: DC | PRN

## 2018-09-30 NOTE — Telephone Encounter (Signed)
Patient called requesting a refill of her pain medication states she sent a message on my chart as well

## 2018-09-30 NOTE — Telephone Encounter (Signed)
Left message letting patient know her prescription has been refilled

## 2018-10-01 DIAGNOSIS — F411 Generalized anxiety disorder: Secondary | ICD-10-CM | POA: Diagnosis not present

## 2018-10-01 DIAGNOSIS — J301 Allergic rhinitis due to pollen: Secondary | ICD-10-CM | POA: Diagnosis not present

## 2018-10-07 ENCOUNTER — Other Ambulatory Visit: Payer: Self-pay | Admitting: Sports Medicine

## 2018-10-07 DIAGNOSIS — Z9889 Other specified postprocedural states: Secondary | ICD-10-CM

## 2018-10-07 MED ORDER — HYDROCODONE-ACETAMINOPHEN 10-325 MG PO TABS: 1.0000 | ORAL_TABLET | Freq: Three times a day (TID) | ORAL | 0 refills | Status: DC | PRN

## 2018-10-07 NOTE — Telephone Encounter (Signed)
Changed quantity for post op pain medication

## 2018-10-14 ENCOUNTER — Other Ambulatory Visit: Payer: Self-pay | Admitting: Sports Medicine

## 2018-10-14 DIAGNOSIS — Z9889 Other specified postprocedural states: Secondary | ICD-10-CM

## 2018-10-14 MED ORDER — HYDROCODONE-ACETAMINOPHEN 10-325 MG PO TABS: 1.0000 | ORAL_TABLET | Freq: Three times a day (TID) | ORAL | 0 refills | Status: DC | PRN

## 2018-10-16 ENCOUNTER — Encounter (INDEPENDENT_AMBULATORY_CARE_PROVIDER_SITE_OTHER): Payer: Self-pay | Admitting: Orthopedic Surgery

## 2018-10-16 NOTE — Telephone Encounter (Signed)
Okay for tramadol 1 p.o. twice daily #30, Robaxin 500 mg p.o. 3 times daily as needed spasm #30 and she needs to get the MRI scan on her back as we ordered last clinic visit.

## 2018-10-17 ENCOUNTER — Other Ambulatory Visit: Payer: Self-pay | Admitting: Sports Medicine

## 2018-10-17 ENCOUNTER — Encounter: Payer: Self-pay | Admitting: Sports Medicine

## 2018-10-17 ENCOUNTER — Other Ambulatory Visit: Payer: Self-pay

## 2018-10-17 ENCOUNTER — Ambulatory Visit (INDEPENDENT_AMBULATORY_CARE_PROVIDER_SITE_OTHER): Payer: 59

## 2018-10-17 ENCOUNTER — Ambulatory Visit (INDEPENDENT_AMBULATORY_CARE_PROVIDER_SITE_OTHER): Payer: 59 | Admitting: Sports Medicine

## 2018-10-17 VITALS — BP 118/74 | HR 90 | Resp 16

## 2018-10-17 DIAGNOSIS — M79672 Pain in left foot: Secondary | ICD-10-CM | POA: Diagnosis not present

## 2018-10-17 DIAGNOSIS — Z9889 Other specified postprocedural states: Secondary | ICD-10-CM

## 2018-10-17 DIAGNOSIS — M21622 Bunionette of left foot: Secondary | ICD-10-CM

## 2018-10-17 DIAGNOSIS — M06072 Rheumatoid arthritis without rheumatoid factor, left ankle and foot: Secondary | ICD-10-CM

## 2018-10-17 DIAGNOSIS — M06071 Rheumatoid arthritis without rheumatoid factor, right ankle and foot: Secondary | ICD-10-CM | POA: Diagnosis not present

## 2018-10-17 DIAGNOSIS — M2040 Other hammer toe(s) (acquired), unspecified foot: Secondary | ICD-10-CM

## 2018-10-17 MED ORDER — DICLOFENAC EPOLAMINE 1.3 % TD PTCH: 1.0000 | MEDICATED_PATCH | Freq: Every day | TRANSDERMAL | 1 refills | Status: DC

## 2018-10-17 MED ORDER — METHOCARBAMOL 500 MG PO TABS: 500.0000 mg | ORAL_TABLET | Freq: Three times a day (TID) | ORAL | 0 refills | Status: DC | PRN

## 2018-10-17 NOTE — Progress Notes (Signed)
Subjective: Quineisha Phenicie is a 28 y.o. female patient seen today in office for POV #5 (DOS 08-11-18), S/P Left tailors bunionectomy with 5th HT repair. Patient admits pain at surgical site that is slowly getting better reports that pain now is 3-6 out of 10 and still swelling worse at the end of the day and on some days by lunch.  States that she has been taking her Norco icing and elevating with improvement and is in normal shoe.  Denies nausea vomiting fever chills.  Patient reports that she has been diagnosed with a spinal condition and is wondering if her body has issues with dealing with inflammation and she should be checked for any type of other secondary arthritides.  No other issues noted.   Patient Active Problem List   Diagnosis Date Noted  . Abnormal MSAFP (maternal serum alpha-fetoprotein), elevated 11/05/2016  . Psychoactive substance use disorder 11/20/2014    Current Outpatient Medications on File Prior to Visit  Medication Sig Dispense Refill  . docusate sodium (COLACE) 100 MG capsule Take 1 capsule (100 mg total) by mouth daily as needed. 30 capsule 2  . escitalopram (LEXAPRO) 10 MG tablet     . HYDROcodone-acetaminophen (NORCO) 10-325 MG tablet Take 1 tablet by mouth every 8 (eight) hours as needed for up to 7 days. 21 tablet 0  . HYDROmorphone (DILAUDID) 2 MG tablet Take 0.5 tablets (1 mg total) by mouth every 4 (four) hours as needed for severe pain. 20 tablet 0  . ibuprofen (ADVIL,MOTRIN) 800 MG tablet Take 1 tablet (800 mg total) by mouth every 8 (eight) hours as needed. 30 tablet 0  . LORazepam (ATIVAN) 0.5 MG tablet     . metFORMIN (GLUCOPHAGE) 500 MG tablet Take 250 mg by mouth daily with breakfast.    . promethazine (PHENERGAN) 25 MG tablet Take 1 tablet (25 mg total) by mouth every 6 (six) hours as needed for nausea or vomiting. 30 tablet 0   No current facility-administered medications on file prior to visit.     No Known Allergies  Objective: There were no  vitals filed for this visit.  General: No acute distress, AAOx3  Left foot: Incision well-healed, mild swelling to left 5th MTPJ, faint erythema, no warmth, no drainage, no acute signs of infection noted, Capillary fill time <3 seconds in all digits, gross sensation present via light touch to left foot. + Mild pain or crepitation with range of motion left foot and burning sensation to area that is slowly improving.  No pain with calf compression.   Right foot: Mild pain at the fifth metatarsal phalangeal joint with bunion deformity noted  X-ray left foot hardware intact normal postoperative findings and mild soft tissue swelling  Assessment and Plan:  Problem List Items Addressed This Visit    None    Visit Diagnoses    Post-operative state    -  Primary   Relevant Orders   DG Foot Complete Left   Tailor's bunion of left foot       Relevant Orders   DG Foot Complete Left   Hammer toe, unspecified laterality       Relevant Orders   DG Foot Complete Left   Left foot pain           -Patient seen and evaluated -X-rays reviewed -Continue with scar creams and good supportive shoes -May discontinue coban splint as long as swelling is very minimal -Prescribed Flector patch for patient to use for inflammation over the  procedure site on left and as needed on right at the bunion site at the fifth metatarsal phalangeal joint is inflamed and sore -Advised patient to limit activity to tolerance -Advised patient to continue with icing rest and elevation and Norco as needed at the end of day so that it does not interfere with her work like before if needs refill will decrease to 5/325mg  to wean off pain medication -Ordered arthritic panel for surveillance with new spinal issue to see if there is any spondylitis that could be contributing to her joint pain however I did advise patient that likely she could be struggling with gait issues since her back problems are pretty prominent as well which can  cause her to have foot pain and in the long run may benefit from custom molded orthotics -Will plan for final post op check and discussion of blood work at next office visit. In the meantime, patient to call office if any issues or problems arise.   Asencion Islamitorya Shannel Zahm, DPM

## 2018-10-17 NOTE — Telephone Encounter (Signed)
Thanks for checking.  We can give her muscle relaxer only in the tramadol.  She needs MRI scan on her back.  Great job figuring this 1 out

## 2018-10-21 ENCOUNTER — Other Ambulatory Visit: Payer: Self-pay | Admitting: Sports Medicine

## 2018-10-21 DIAGNOSIS — Z9889 Other specified postprocedural states: Secondary | ICD-10-CM

## 2018-10-21 LAB — CBC WITH DIFFERENTIAL/PLATELET
Basophils Absolute: 0 10*3/uL (ref 0.0–0.2)
Basos: 1 %
EOS (ABSOLUTE): 0.2 10*3/uL (ref 0.0–0.4)
Eos: 4 %
Hematocrit: 39.4 % (ref 34.0–46.6)
Hemoglobin: 13.7 g/dL (ref 11.1–15.9)
Immature Grans (Abs): 0 10*3/uL (ref 0.0–0.1)
Immature Granulocytes: 0 %
Lymphocytes Absolute: 1.5 10*3/uL (ref 0.7–3.1)
Lymphs: 28 %
MCH: 32.9 pg (ref 26.6–33.0)
MCHC: 34.8 g/dL (ref 31.5–35.7)
MCV: 95 fL (ref 79–97)
Monocytes Absolute: 0.4 10*3/uL (ref 0.1–0.9)
Monocytes: 8 %
Neutrophils Absolute: 3.3 10*3/uL (ref 1.4–7.0)
Neutrophils: 59 %
Platelets: 240 10*3/uL (ref 150–450)
RBC: 4.17 x10E6/uL (ref 3.77–5.28)
RDW: 12.1 % (ref 11.7–15.4)
WBC: 5.4 10*3/uL (ref 3.4–10.8)

## 2018-10-21 LAB — URIC ACID: Uric Acid: 6.2 mg/dL (ref 2.5–7.1)

## 2018-10-21 LAB — C-REACTIVE PROTEIN: CRP: 5 mg/L (ref 0–10)

## 2018-10-21 LAB — HLA-B27 ANTIGEN: HLA B27: NEGATIVE

## 2018-10-21 LAB — RHEUMATOID FACTOR: Rheumatoid fact SerPl-aCnc: <10 IU/mL (ref 0.0–13.9)

## 2018-10-21 LAB — SEDIMENTATION RATE: Sed Rate: 3 mm/hr (ref 0–32)

## 2018-10-22 LAB — SPECIMEN STATUS REPORT

## 2018-10-22 MED ORDER — HYDROCODONE-ACETAMINOPHEN 10-325 MG PO TABS: 1.0000 | ORAL_TABLET | Freq: Three times a day (TID) | ORAL | 0 refills | Status: DC | PRN

## 2018-10-22 NOTE — Telephone Encounter (Signed)
Dr Marylene Land please advise on refill

## 2018-10-27 ENCOUNTER — Encounter: Payer: Self-pay | Admitting: Orthopedic Surgery

## 2018-10-27 ENCOUNTER — Encounter: Payer: Self-pay | Admitting: Sports Medicine

## 2018-10-27 DIAGNOSIS — M545 Low back pain, unspecified: Secondary | ICD-10-CM

## 2018-10-27 DIAGNOSIS — G8929 Other chronic pain: Secondary | ICD-10-CM

## 2018-10-27 NOTE — Telephone Encounter (Signed)
I agree and I think that this likely is coming from her back.  She should come in and see Dr. Alvester Morin for shot in back.  Thanks

## 2018-10-28 ENCOUNTER — Other Ambulatory Visit: Payer: Self-pay | Admitting: Sports Medicine

## 2018-10-28 DIAGNOSIS — Z9889 Other specified postprocedural states: Secondary | ICD-10-CM

## 2018-10-29 MED ORDER — HYDROCODONE-ACETAMINOPHEN 10-325 MG PO TABS: 1.0000 | ORAL_TABLET | Freq: Two times a day (BID) | ORAL | 0 refills | Status: DC | PRN

## 2018-10-29 MED ORDER — PROMETHAZINE HCL 25 MG PO TABS: 25.0000 mg | ORAL_TABLET | Freq: Four times a day (QID) | ORAL | 0 refills | Status: DC | PRN

## 2018-10-29 NOTE — Telephone Encounter (Signed)
Possibly.  Does she want to see a back surgeon?

## 2018-10-30 MED FILL — MONTELUKAST SOD 10 MG TAB: 10 | 90 days supply | Qty: 90 | Fill #0

## 2018-11-03 NOTE — Telephone Encounter (Signed)
Lab work negative for gout.  Labs really look pretty good and do not really give insight into the origin of the elbow pain.

## 2018-11-04 ENCOUNTER — Other Ambulatory Visit: Payer: Self-pay | Admitting: Sports Medicine

## 2018-11-04 DIAGNOSIS — Z9889 Other specified postprocedural states: Secondary | ICD-10-CM

## 2018-11-04 MED ORDER — HYDROCODONE-ACETAMINOPHEN 7.5-325 MG PO TABS: 1.0000 | ORAL_TABLET | Freq: Two times a day (BID) | ORAL | 0 refills | Status: AC | PRN

## 2018-11-04 NOTE — Progress Notes (Signed)
Change Rx from 10mg  to 7.5mg  of Norco Patient must wean off pain medication or be referred to pain management -Dr. Kathie Rhodes

## 2018-11-08 DIAGNOSIS — M47817 Spondylosis without myelopathy or radiculopathy, lumbosacral region: Secondary | ICD-10-CM | POA: Diagnosis not present

## 2018-11-08 DIAGNOSIS — G8929 Other chronic pain: Secondary | ICD-10-CM | POA: Diagnosis not present

## 2018-11-08 DIAGNOSIS — M792 Neuralgia and neuritis, unspecified: Secondary | ICD-10-CM | POA: Diagnosis not present

## 2018-11-08 DIAGNOSIS — M545 Low back pain: Secondary | ICD-10-CM | POA: Diagnosis not present

## 2018-11-08 DIAGNOSIS — N912 Amenorrhea, unspecified: Secondary | ICD-10-CM | POA: Diagnosis not present

## 2018-11-08 DIAGNOSIS — Z79899 Other long term (current) drug therapy: Secondary | ICD-10-CM | POA: Diagnosis not present

## 2018-11-13 ENCOUNTER — Other Ambulatory Visit (INDEPENDENT_AMBULATORY_CARE_PROVIDER_SITE_OTHER): Payer: Self-pay | Admitting: Orthopedic Surgery

## 2018-11-13 MED ORDER — METHOCARBAMOL 500 MG PO TABS: 500.0000 mg | ORAL_TABLET | Freq: Three times a day (TID) | ORAL | 0 refills | Status: DC | PRN

## 2018-11-13 NOTE — Telephone Encounter (Signed)
This is a Dr. Dean pt.  

## 2018-11-13 NOTE — Telephone Encounter (Signed)
y

## 2018-11-13 NOTE — Telephone Encounter (Signed)
Ok to rf? 

## 2018-11-15 DIAGNOSIS — G8929 Other chronic pain: Secondary | ICD-10-CM | POA: Diagnosis not present

## 2018-11-15 DIAGNOSIS — Z6831 Body mass index (BMI) 31.0-31.9, adult: Secondary | ICD-10-CM | POA: Diagnosis not present

## 2018-11-15 DIAGNOSIS — Z79899 Other long term (current) drug therapy: Secondary | ICD-10-CM | POA: Diagnosis not present

## 2018-11-15 DIAGNOSIS — M545 Low back pain: Secondary | ICD-10-CM | POA: Diagnosis not present

## 2018-11-15 DIAGNOSIS — M5136 Other intervertebral disc degeneration, lumbar region: Secondary | ICD-10-CM | POA: Diagnosis not present

## 2018-11-21 ENCOUNTER — Other Ambulatory Visit: Payer: Self-pay

## 2018-11-21 ENCOUNTER — Encounter: Payer: Self-pay | Admitting: Sports Medicine

## 2018-11-21 ENCOUNTER — Ambulatory Visit (INDEPENDENT_AMBULATORY_CARE_PROVIDER_SITE_OTHER): Payer: 59 | Admitting: Sports Medicine

## 2018-11-21 VITALS — Temp 98.3°F | Resp 16

## 2018-11-21 DIAGNOSIS — Z9889 Other specified postprocedural states: Secondary | ICD-10-CM

## 2018-11-21 DIAGNOSIS — M21622 Bunionette of left foot: Secondary | ICD-10-CM

## 2018-11-21 DIAGNOSIS — M2040 Other hammer toe(s) (acquired), unspecified foot: Secondary | ICD-10-CM

## 2018-11-21 DIAGNOSIS — M79672 Pain in left foot: Secondary | ICD-10-CM

## 2018-11-21 NOTE — Progress Notes (Signed)
Subjective: Brianna Martin is a 28 y.o. female patient seen today in office for POV #6 (DOS 08-11-18), S/P Left tailors bunionectomy with 5th HT repair. Patient admits pain at surgical site that is about the same, 5 out of 10 and still swelling worse at the end of the day underneath 5th toe.  On lyrica now for back with numbness over her entire foot.  Patient has been using Flector patches as prescribed.  No other issues noted.   Patient Active Problem List   Diagnosis Date Noted  . Abnormal MSAFP (maternal serum alpha-fetoprotein), elevated 11/05/2016  . Psychoactive substance use disorder 11/20/2014    Current Outpatient Medications on File Prior to Visit  Medication Sig Dispense Refill  . diclofenac (FLECTOR) 1.3 % PTCH Place 1 patch onto the skin daily. 30 patch 1  . docusate sodium (COLACE) 100 MG capsule Take 1 capsule (100 mg total) by mouth daily as needed. 30 capsule 2  . escitalopram (LEXAPRO) 10 MG tablet     . HYDROmorphone (DILAUDID) 2 MG tablet Take 0.5 tablets (1 mg total) by mouth every 4 (four) hours as needed for severe pain. 20 tablet 0  . ibuprofen (ADVIL,MOTRIN) 800 MG tablet Take 1 tablet (800 mg total) by mouth every 8 (eight) hours as needed. 30 tablet 0  . LORazepam (ATIVAN) 0.5 MG tablet     . metFORMIN (GLUCOPHAGE) 500 MG tablet Take 250 mg by mouth daily with breakfast.    . methocarbamol (ROBAXIN) 500 MG tablet Take 1 tablet (500 mg total) by mouth every 8 (eight) hours as needed for muscle spasms. 30 tablet 0  . promethazine (PHENERGAN) 25 MG tablet Take 1 tablet (25 mg total) by mouth every 6 (six) hours as needed for nausea or vomiting. 30 tablet 0   No current facility-administered medications on file prior to visit.     No Known Allergies  Objective: There were no vitals filed for this visit.  General: No acute distress, AAOx3  Left foot: Incision well-healed, mild swelling to left 5th MTPJ, no warmth, no drainage, no acute signs of infection noted,  Capillary fill time <3 seconds in all digits, gross sensation present via light touch to left foot. + Mild pain or crepitation with range of motion left foot and numbness to entire foot,  No pain with calf compression. Left bunion at 1st MPJ.   Right foot: Mild pain at the fifth metatarsal phalangeal joint with bunion deformity noted.   Assessment and Plan:  Problem List Items Addressed This Visit    None    Visit Diagnoses    Tailor's bunion of left foot    -  Primary   Hammer toe, unspecified laterality       Left foot pain       Post-operative state           -Patient seen and evaluated -Patient declines steroid injection on the left foot -Continue with scar creams and good supportive shoes -Continue with flector patch -Applied felt offloading padding and advised patient may benefit from orthotic management to help decrease the pressure off the previous surgical bunionette site -Gave patient bunion shield to use to protect the bunion at the left first metatarsal phalangeal joint -Continue with Lyrica and medications as given by her backslash pain doctor -Return PRN or sooner if problems or issues arise.  Asencion Islam, DPM

## 2018-11-28 ENCOUNTER — Other Ambulatory Visit: Payer: Self-pay

## 2018-11-28 ENCOUNTER — Encounter: Payer: Self-pay | Admitting: Orthopedic Surgery

## 2018-11-28 ENCOUNTER — Ambulatory Visit (INDEPENDENT_AMBULATORY_CARE_PROVIDER_SITE_OTHER): Payer: 59 | Admitting: Orthopedic Surgery

## 2018-11-28 ENCOUNTER — Ambulatory Visit (INDEPENDENT_AMBULATORY_CARE_PROVIDER_SITE_OTHER): Payer: 59

## 2018-11-28 DIAGNOSIS — M25561 Pain in right knee: Secondary | ICD-10-CM

## 2018-12-02 ENCOUNTER — Encounter: Payer: Self-pay | Admitting: Orthopedic Surgery

## 2018-12-02 DIAGNOSIS — M25561 Pain in right knee: Secondary | ICD-10-CM

## 2018-12-02 DIAGNOSIS — H5213 Myopia, bilateral: Secondary | ICD-10-CM | POA: Diagnosis not present

## 2018-12-02 MED ORDER — METHYLPREDNISOLONE ACETATE 40 MG/ML IJ SUSP
40.0000 mg | INTRAMUSCULAR | Status: AC | PRN
  Administered 2018-12-02: 40 mg via INTRA_ARTICULAR

## 2018-12-02 MED ORDER — LIDOCAINE HCL 1 % IJ SOLN
5.0000 mL | INTRAMUSCULAR | Status: AC | PRN
  Administered 2018-12-02: 5 mL

## 2018-12-02 MED ORDER — BUPIVACAINE HCL 0.25 % IJ SOLN
4.0000 mL | INTRAMUSCULAR | Status: AC | PRN
  Administered 2018-12-02: 4 mL via INTRA_ARTICULAR

## 2018-12-02 NOTE — Progress Notes (Signed)
Office Visit Note   Patient: Lannie FieldsKelli Renee Fata           Date of Birth: 07/16/1990           MRN: [id number] Visit Date: 11/28/2018 Requested by: Gus HeightJohnson, Andrea, PA-C [address] [place], [place] [id number] PCP: Gus HeightJohnson, Andrea, PA-C  Subjective: Chief Complaint  Patient presents with  . Right Knee - Pain    HPI: Brianna Martin is a patient with right-sided knee pain 1 month duration.  Reports mostly posterior pain but it increases with flexion and extension.  Denies any weakness giving way locking or popping.  Been using a TENS unit for her back.  N stiff in the morning as well as after she has been sitting for about 4 weeks.  She is in pain management for her back and she takes Lyrica and pain medicine for that.  She likes to exercise and run.              ROS: All systems reviewed are negative as they relate to the chief complaint within the history of present illness.  Patient denies  fevers or chills.   Assessment & Plan: Visit Diagnoses:  1. Right knee pain, unspecified chronicity     Plan: Impression is right knee pain with normal radiographs normal exam.  Most of her pain seems to be retropatellar in nature.  I think we should try open patella knee sleeve and cortisone injection.  She will call me in 2 weeks and we can get an MRI scan if her symptoms have not improved.  She has been doing her home exercise program of exercising and running but still has knee pain.  Call me in 2 weeks if no better and we will get the knee scanned.  Follow-Up Instructions: Return if symptoms worsen or fail to improve.   Orders:  Orders Placed This Encounter  Procedures  . XR KNEE 3 VIEW RIGHT   No orders of the defined types were placed in this encounter.     Procedures: Large Joint Inj: R knee on 12/02/2018 4:04 PM Indications: diagnostic evaluation, joint swelling and pain Details: 18 G 1.5 in needle, superolateral approach  Arthrogram: No  Medications: 5 mL lidocaine 1 %; 40 mg  methylPREDNISolone acetate 40 MG/ML; 4 mL bupivacaine 0.25 % Outcome: tolerated well, no immediate complications Procedure, treatment alternatives, risks and benefits explained, specific risks discussed. Consent was given by the patient. Immediately prior to procedure a time out was called to verify the correct patient, procedure, equipment, support staff and site/side marked as required. Patient was prepped and draped in the usual sterile fashion.       Clinical Data: No additional findings.  Objective: Vital Signs: There were no vitals taken for this visit.  Physical Exam:   Constitutional: Patient appears well-developed HEENT:  Head: Normocephalic Eyes:EOM are normal Neck: Normal range of motion Cardiovascular: Normal rate Pulmonary/chest: Effort normal Neurologic: Patient is alert Skin: Skin is warm Psychiatric: Patient has normal mood and affect    Ortho Exam: Ortho exam demonstrates full active and passive range of motion of that right knee.  Negative patellar apprehension.  Mild periretinacular tenderness.  No focal joint line tenderness.  Collateral crucial ligaments are stable.  Range of motion is full.  No other masses lymphadenopathy or skin changes noted in that right knee region.  Specialty Comments:  No specialty comments available.  Imaging: No results found.   PMFS History: Patient Active Problem List  Diagnosis Date Noted  . Abnormal MSAFP (maternal serum alpha-fetoprotein), elevated 11/05/2016  . Psychoactive substance use disorder 11/20/2014   Past Medical History:  Diagnosis Date  . Anxiety   . Asthma   . Depression   . History of PCOS   . MRSA (methicillin resistant Staphylococcus aureus)    x2 Rt knee Lt elbow  . Neck pain, chronic     Family History  Problem Relation Age of Onset  . Urticaria Neg Hx   . Immunodeficiency Neg Hx   . Eczema Neg Hx   . Atopy Neg Hx   . Asthma Neg Hx   . Angioedema Neg Hx   . Allergic rhinitis Neg Hx      Past Surgical History:  Procedure Laterality Date  . ADENOIDECTOMY    . BUNIONECTOMY Left 08/11/2018   Procedure: TAYLOR'S BUNIONECTOMY LEFT FOOT;  Surgeon: Landis Martins, DPM;  Location: [facility];  Service: Podiatry;  Laterality: Left;  . CESAREAN SECTION    . DILATION AND CURETTAGE OF UTERUS    . EXPLORATORY LAPAROTOMY    . HAMMER TOE SURGERY Left 08/11/2018   Procedure: 5TH HAMMER TOE CORRECTION LEFT FOOT;  Surgeon: Landis Martins, DPM;  Location: [facility];  Service: Podiatry;  Laterality: Left;  . TONSILLECTOMY    . WISDOM TOOTH EXTRACTION     Social History   Occupational History  . Not on file  Tobacco Use  . Smoking status: Former Smoker    Packs/day: 0.50    Years: 2.00    Pack years: 1.00    Types: Cigarettes    Last attempt to quit: 06/21/2016    Years since quitting: 2.4  . Smokeless tobacco: Never Used  . Tobacco comment: no cigs, pt vapes now since 05/2016  Substance and Sexual Activity  . Alcohol use: No  . Drug use: No  . Sexual activity: Yes    Birth control/protection: None

## 2018-12-09 ENCOUNTER — Other Ambulatory Visit: Payer: Self-pay

## 2018-12-09 ENCOUNTER — Ambulatory Visit (INDEPENDENT_AMBULATORY_CARE_PROVIDER_SITE_OTHER): Payer: 59 | Admitting: Allergy

## 2018-12-09 ENCOUNTER — Encounter: Payer: Self-pay | Admitting: Allergy

## 2018-12-09 VITALS — BP 92/56 | HR 68 | Temp 98.0°F | Resp 20 | Ht 62.0 in | Wt 164.6 lb

## 2018-12-09 DIAGNOSIS — H109 Unspecified conjunctivitis: Secondary | ICD-10-CM | POA: Diagnosis not present

## 2018-12-09 DIAGNOSIS — J310 Chronic rhinitis: Secondary | ICD-10-CM | POA: Diagnosis not present

## 2018-12-09 MED ORDER — IPRATROPIUM BROMIDE 0.06 % NA SOLN: NASAL | 5 refills | Status: DC

## 2018-12-09 NOTE — Patient Instructions (Addendum)
Rhinitis with conjunctivitis  - environmental allergy skin testing today is mildly reactive to molds  - allergen avoidance measures provided  - believe however most of her symptoms are triggered in a non-allergic manner  - will try nasal Atrovent 2 sprays each nostril twice a day as needed for nasal drainage   Follow-up 6-12 months or sooner if needed

## 2018-12-09 NOTE — Progress Notes (Signed)
Follow-up Note  RE: Andalyn Heckstall MRN: [id number] DOB: 12/10/1990 Date of Office Visit: 12/09/2018   History of present illness: Brianna Martin is a 28 y.o. female presenting today for allergy skin testing.  She has history of allergic rhinitis.  She reports symptoms of sneezing, PND with throat clearing daily with occasional red and itchy eyes.  Symptoms primarily in spring season.  She has tried dymista but didn't notice much difference in PND.  Also has tried Human resources officer without much improvement.   She is currently taking dexilant.  No history of eczema or food allergy. History of childhood exercise induced asthma however reports no issues in adulthood.    Review of systems: Review of Systems  Constitutional: Negative for chills, fever and malaise/fatigue.  HENT: Negative for congestion, ear discharge, nosebleeds, sinus pain and sore throat.   Eyes: Negative for pain, discharge and redness.  Respiratory: Negative for cough, shortness of breath and wheezing.   Cardiovascular: Negative for chest pain.  Gastrointestinal: Negative for abdominal pain, constipation, diarrhea, heartburn, nausea and vomiting.  Musculoskeletal: Positive for back pain and joint pain.  Skin: Negative for itching and rash.  Neurological: Negative for headaches.    All other systems negative unless noted above in HPI  Past medical/social/surgical/family history have been reviewed and are unchanged unless specifically indicated below.  No changes  Medication List: Allergies as of 12/09/2018   No Known Allergies     Medication List       Accurate as of December 09, 2018  3:32 PM. If you have any questions, ask your nurse or doctor.        STOP taking these medications   HYDROmorphone 2 MG tablet Commonly known as: Dilaudid Stopped by:  Charmian Muff, MD     TAKE these medications   Biotin 5000 MCG Tabs Take by mouth.   Dexilant 60 MG capsule Generic drug: dexlansoprazole Take  60 mg by mouth daily.   diclofenac 1.3 % Ptch Commonly known as: FLECTOR Place 1 patch onto the skin daily.   docusate sodium 100 MG capsule Commonly known as: Colace Take 1 capsule (100 mg total) by mouth daily as needed.   escitalopram 20 MG tablet Commonly known as: LEXAPRO Take 20 mg by mouth daily. What changed: Another medication with the same name was removed. Continue taking this medication, and follow the directions you see here. Changed by:  Charmian Muff, MD   ibuprofen 800 MG tablet Commonly known as: ADVIL Take 1 tablet (800 mg total) by mouth every 8 (eight) hours as needed.   LORazepam 0.5 MG tablet Commonly known as: ATIVAN   metFORMIN 500 MG tablet Commonly known as: GLUCOPHAGE Take 250 mg by mouth daily with breakfast.   methocarbamol 500 MG tablet Commonly known as: ROBAXIN Take 1 tablet (500 mg total) by mouth every 8 (eight) hours as needed for muscle spasms.   montelukast 10 MG tablet Commonly known as: SINGULAIR Take 10 mg by mouth at bedtime.   pregabalin 75 MG capsule Commonly known as: LYRICA Take 75 mg by mouth 2 (two) times daily.   promethazine 25 MG tablet Commonly known as: PHENERGAN Take 1 tablet (25 mg total) by mouth every 6 (six) hours as needed for nausea or vomiting.   TURMERIC PO Take by mouth.       Known medication allergies: No Known Allergies   Physical examination: Blood pressure (!) 92/56, pulse 68, temperature 98 F (36.7 C), temperature source Temporal, resp. rate 20,  height 5\' 2"  (1.575 m), weight 164 lb 9.6 oz (74.7 kg), SpO2 95 %, currently breastfeeding.  General: Alert, interactive, in no acute distress. HEENT: PERRLA, TMs pearly gray, turbinates mildly edematous with clear discharge, post-pharynx non erythematous with +mild cobblestoning Neck: Supple without lymphadenopathy. Lungs: Clear to auscultation without wheezing, rhonchi or rales. {no increased work of breathing. CV: Normal S1, S2  without murmurs. Abdomen: Nondistended, nontender. Skin: Warm and dry, without lesions or rashes. Extremities:  No clubbing, cyanosis or edema. Neuro:   Grossly intact.  Diagnositics/Labs: Allergy testing: environmental allergy skin prick testing is negative with positive histamine control.   Intradermal testing is mildly reactive to mold mix 4   Allergy testing results were read and interpreted by provider, documented by clinical staff.   Assessment and plan:   Rhinitis with conjunctivitis  - environmental allergy skin testing today is mildly reactive to molds  - allergen avoidance measures provided  - believe however most of her symptoms are triggered in a non-allergic manner as irritants to the nasal passages and airway  - will try nasal Atrovent 2 sprays each nostril twice a day as needed for nasal drainage   Follow-up 6-12 months or sooner if needed    I appreciate the opportunity to take part in [facility]'s care. Please do not hesitate to contact me with questions.  Sincerely,   Margo AyeShaylar , MD Allergy/Immunology [facility]

## 2018-12-12 DIAGNOSIS — G8929 Other chronic pain: Secondary | ICD-10-CM | POA: Diagnosis not present

## 2018-12-12 DIAGNOSIS — M545 Low back pain: Secondary | ICD-10-CM | POA: Diagnosis not present

## 2018-12-12 DIAGNOSIS — M5136 Other intervertebral disc degeneration, lumbar region: Secondary | ICD-10-CM | POA: Diagnosis not present

## 2018-12-12 DIAGNOSIS — Z6831 Body mass index (BMI) 31.0-31.9, adult: Secondary | ICD-10-CM | POA: Diagnosis not present

## 2018-12-12 DIAGNOSIS — Z79899 Other long term (current) drug therapy: Secondary | ICD-10-CM | POA: Diagnosis not present

## 2018-12-17 DIAGNOSIS — M545 Low back pain: Secondary | ICD-10-CM | POA: Diagnosis not present

## 2018-12-17 DIAGNOSIS — M5136 Other intervertebral disc degeneration, lumbar region: Secondary | ICD-10-CM | POA: Diagnosis not present

## 2018-12-19 MED FILL — metFORMIN HCL 500 MG TABS: 500 | 90 days supply | Qty: 45 | Fill #0

## 2018-12-19 MED FILL — ESCITALOPRAM 20 MG TABLET: 20 | 90 days supply | Qty: 90 | Fill #0

## 2018-12-23 ENCOUNTER — Other Ambulatory Visit: Payer: Self-pay

## 2018-12-23 MED ORDER — AMOXICILLIN-POT CLAVULANATE 875-125 MG PO TABS: ORAL_TABLET | ORAL | 0 refills | Status: DC

## 2018-12-25 ENCOUNTER — Other Ambulatory Visit (INDEPENDENT_AMBULATORY_CARE_PROVIDER_SITE_OTHER): Payer: Self-pay | Admitting: Orthopedic Surgery

## 2018-12-25 MED ORDER — METHOCARBAMOL 500 MG PO TABS: 500.0000 mg | ORAL_TABLET | Freq: Three times a day (TID) | ORAL | 0 refills | Status: DC | PRN

## 2018-12-25 NOTE — Telephone Encounter (Signed)
Dean Patient

## 2018-12-25 NOTE — Telephone Encounter (Signed)
Y thx

## 2018-12-25 NOTE — Telephone Encounter (Signed)
Ok to rf? 

## 2018-12-30 DIAGNOSIS — M5136 Other intervertebral disc degeneration, lumbar region: Secondary | ICD-10-CM | POA: Diagnosis not present

## 2018-12-30 DIAGNOSIS — M545 Low back pain: Secondary | ICD-10-CM | POA: Diagnosis not present

## 2018-12-31 DIAGNOSIS — J018 Other acute sinusitis: Secondary | ICD-10-CM | POA: Diagnosis not present

## 2018-12-31 DIAGNOSIS — F411 Generalized anxiety disorder: Secondary | ICD-10-CM | POA: Diagnosis not present

## 2019-01-05 DIAGNOSIS — Z79899 Other long term (current) drug therapy: Secondary | ICD-10-CM | POA: Diagnosis not present

## 2019-01-05 DIAGNOSIS — M5136 Other intervertebral disc degeneration, lumbar region: Secondary | ICD-10-CM | POA: Diagnosis not present

## 2019-01-05 DIAGNOSIS — Z6831 Body mass index (BMI) 31.0-31.9, adult: Secondary | ICD-10-CM | POA: Diagnosis not present

## 2019-01-05 DIAGNOSIS — M545 Low back pain: Secondary | ICD-10-CM | POA: Diagnosis not present

## 2019-01-05 DIAGNOSIS — G8929 Other chronic pain: Secondary | ICD-10-CM | POA: Diagnosis not present

## 2019-01-19 ENCOUNTER — Other Ambulatory Visit: Payer: Self-pay | Admitting: Physical Medicine and Rehabilitation

## 2019-01-19 ENCOUNTER — Encounter: Payer: 59 | Admitting: Physical Medicine and Rehabilitation

## 2019-01-27 ENCOUNTER — Encounter: Payer: Self-pay | Admitting: Orthopedic Surgery

## 2019-01-27 DIAGNOSIS — A0839 Other viral enteritis: Secondary | ICD-10-CM | POA: Diagnosis not present

## 2019-01-27 MED ORDER — BACLOFEN 10 MG PO TABS: ORAL_TABLET | ORAL | 0 refills | Status: DC

## 2019-01-27 NOTE — Telephone Encounter (Signed)
Ok for baclofen 10 mg po q 8 # 30

## 2019-02-01 DIAGNOSIS — Z3200 Encounter for pregnancy test, result unknown: Secondary | ICD-10-CM | POA: Diagnosis not present

## 2019-02-01 DIAGNOSIS — G8929 Other chronic pain: Secondary | ICD-10-CM | POA: Diagnosis not present

## 2019-02-01 DIAGNOSIS — Z79899 Other long term (current) drug therapy: Secondary | ICD-10-CM | POA: Diagnosis not present

## 2019-02-01 DIAGNOSIS — M545 Low back pain: Secondary | ICD-10-CM | POA: Diagnosis not present

## 2019-02-01 DIAGNOSIS — Z79891 Long term (current) use of opiate analgesic: Secondary | ICD-10-CM | POA: Diagnosis not present

## 2019-02-01 DIAGNOSIS — M5136 Other intervertebral disc degeneration, lumbar region: Secondary | ICD-10-CM | POA: Diagnosis not present

## 2019-02-02 DIAGNOSIS — A0839 Other viral enteritis: Secondary | ICD-10-CM | POA: Diagnosis not present

## 2019-02-03 ENCOUNTER — Telehealth: Payer: Self-pay | Admitting: *Deleted

## 2019-02-03 ENCOUNTER — Other Ambulatory Visit: Payer: Self-pay | Admitting: *Deleted

## 2019-02-03 MED ORDER — METRONIDAZOLE 250 MG PO TABS: 250.0000 mg | ORAL_TABLET | Freq: Four times a day (QID) | ORAL | 0 refills | Status: AC

## 2019-02-03 MED ORDER — METRONIDAZOLE 250 MG PO TABS: 250.0000 mg | ORAL_TABLET | Freq: Four times a day (QID) | ORAL | 0 refills | Status: DC

## 2019-02-03 NOTE — Telephone Encounter (Signed)
-----   Message from Jiles Prows, MD sent at 02/03/2019  2:50 PM EDT ----- Patient has had 2 weeks of explosive diarrhea pending stool culture results.  This developed within 5 days of completing a course of Levaquin.  Will prescribe Flagyl 250 - 4 times a day for 10 days and change therapy pending results of stool cultures.

## 2019-02-03 NOTE — Telephone Encounter (Signed)
Sent script to pharmacy. 

## 2019-02-06 ENCOUNTER — Other Ambulatory Visit: Payer: Self-pay | Admitting: *Deleted

## 2019-02-06 MED ORDER — MONTELUKAST SODIUM 10 MG PO TABS: 10.0000 mg | ORAL_TABLET | Freq: Every day | ORAL | 1 refills | Status: DC

## 2019-02-13 ENCOUNTER — Other Ambulatory Visit: Payer: Self-pay | Admitting: Orthopedic Surgery

## 2019-02-13 NOTE — Telephone Encounter (Signed)
Okay to refill please call thanks

## 2019-02-13 NOTE — Telephone Encounter (Signed)
Please advise 

## 2019-02-16 MED ORDER — BACLOFEN 10 MG PO TABS: ORAL_TABLET | ORAL | 0 refills | Status: DC

## 2019-02-17 ENCOUNTER — Encounter: Payer: Self-pay | Admitting: Orthopedic Surgery

## 2019-02-17 NOTE — Telephone Encounter (Signed)
I think it probably is coming from the back.  May want to consider getting injections in the back.

## 2019-02-20 DIAGNOSIS — G8929 Other chronic pain: Secondary | ICD-10-CM | POA: Diagnosis not present

## 2019-02-20 DIAGNOSIS — M545 Low back pain: Secondary | ICD-10-CM | POA: Diagnosis not present

## 2019-02-20 DIAGNOSIS — M5136 Other intervertebral disc degeneration, lumbar region: Secondary | ICD-10-CM | POA: Diagnosis not present

## 2019-02-20 DIAGNOSIS — Z1159 Encounter for screening for other viral diseases: Secondary | ICD-10-CM | POA: Diagnosis not present

## 2019-02-20 DIAGNOSIS — Z3200 Encounter for pregnancy test, result unknown: Secondary | ICD-10-CM | POA: Diagnosis not present

## 2019-02-20 DIAGNOSIS — Z79891 Long term (current) use of opiate analgesic: Secondary | ICD-10-CM | POA: Diagnosis not present

## 2019-02-20 DIAGNOSIS — Z79899 Other long term (current) drug therapy: Secondary | ICD-10-CM | POA: Diagnosis not present

## 2019-02-27 DIAGNOSIS — M545 Low back pain: Secondary | ICD-10-CM | POA: Diagnosis not present

## 2019-02-27 DIAGNOSIS — M5416 Radiculopathy, lumbar region: Secondary | ICD-10-CM | POA: Diagnosis not present

## 2019-03-05 ENCOUNTER — Other Ambulatory Visit: Payer: Self-pay

## 2019-03-05 ENCOUNTER — Telehealth: Payer: Self-pay | Admitting: Allergy

## 2019-03-05 MED ORDER — BENZOCAINE 20 % MT GEL: OROMUCOSAL | 0 refills | Status: DC

## 2019-03-05 NOTE — Telephone Encounter (Signed)
[place] for Benzocaine 20% gel sent to [facility]. Patient informed.

## 2019-03-05 NOTE — Telephone Encounter (Signed)
For pain relief related to mouth ulcers recommend use of benzocaine ointment 20% Apply thin layer to affected area 4 times daily as needed.

## 2019-03-06 MED FILL — buPROPion HCL 75 MG TABS: 75 | 90 days supply | Qty: 90 | Fill #0

## 2019-03-20 ENCOUNTER — Telehealth: Payer: Self-pay | Admitting: *Deleted

## 2019-03-20 DIAGNOSIS — M5136 Other intervertebral disc degeneration, lumbar region: Secondary | ICD-10-CM | POA: Diagnosis not present

## 2019-03-20 DIAGNOSIS — Z79891 Long term (current) use of opiate analgesic: Secondary | ICD-10-CM | POA: Diagnosis not present

## 2019-03-20 DIAGNOSIS — Z3200 Encounter for pregnancy test, result unknown: Secondary | ICD-10-CM | POA: Diagnosis not present

## 2019-03-20 DIAGNOSIS — Z79899 Other long term (current) drug therapy: Secondary | ICD-10-CM | POA: Diagnosis not present

## 2019-03-20 NOTE — Telephone Encounter (Signed)
Error

## 2019-03-24 MED FILL — metFORMIN HCL 500 MG TABS: 500 | 90 days supply | Qty: 45 | Fill #0

## 2019-04-02 MED FILL — ESCITALOPRAM 20 MG TABLET: 20 | 30 days supply | Qty: 30 | Fill #0

## 2019-04-03 ENCOUNTER — Other Ambulatory Visit: Payer: Self-pay

## 2019-04-03 MED ORDER — IPRATROPIUM BROMIDE 0.06 % NA SOLN: NASAL | 5 refills | Status: AC

## 2019-04-10 MED FILL — IPRATROPIUM 0.06% SPRAY: 0.06 | 62 days supply | Qty: 45 | Fill #0

## 2019-04-24 ENCOUNTER — Other Ambulatory Visit: Payer: Self-pay

## 2019-04-24 DIAGNOSIS — J309 Allergic rhinitis, unspecified: Secondary | ICD-10-CM

## 2019-04-24 NOTE — Progress Notes (Signed)
Per Dr. Nelva Bush will do blood work due to testing being negative.

## 2019-04-25 DIAGNOSIS — M792 Neuralgia and neuritis, unspecified: Secondary | ICD-10-CM | POA: Diagnosis not present

## 2019-04-25 DIAGNOSIS — Z79891 Long term (current) use of opiate analgesic: Secondary | ICD-10-CM | POA: Diagnosis not present

## 2019-04-25 DIAGNOSIS — F1120 Opioid dependence, uncomplicated: Secondary | ICD-10-CM | POA: Diagnosis not present

## 2019-04-25 DIAGNOSIS — Z79899 Other long term (current) drug therapy: Secondary | ICD-10-CM | POA: Diagnosis not present

## 2019-04-25 DIAGNOSIS — R300 Dysuria: Secondary | ICD-10-CM | POA: Diagnosis not present

## 2019-04-25 DIAGNOSIS — Z3200 Encounter for pregnancy test, result unknown: Secondary | ICD-10-CM | POA: Diagnosis not present

## 2019-04-25 DIAGNOSIS — M5136 Other intervertebral disc degeneration, lumbar region: Secondary | ICD-10-CM | POA: Diagnosis not present

## 2019-04-27 LAB — ALLERGENS W/TOTAL IGE AREA 2
Alternaria Alternata IgE: <0.1 kU/L
Aspergillus Fumigatus IgE: <0.1 kU/L
Bermuda Grass IgE: <0.1 kU/L
Cat Dander IgE: <0.1 kU/L
Cedar, Mountain IgE: <0.1 kU/L
Cladosporium Herbarum IgE: <0.1 kU/L
Cockroach, German IgE: <0.1 kU/L
Common Silver Birch IgE: <0.1 kU/L
Cottonwood IgE: <0.1 kU/L
D Farinae IgE: <0.1 kU/L
D Pteronyssinus IgE: <0.1 kU/L
Dog Dander IgE: <0.1 kU/L
Elm, American IgE: <0.1 kU/L
IgE (Immunoglobulin E), Serum: 3 IU/mL — ABNORMAL LOW (ref 6–495)
Johnson Grass IgE: <0.1 kU/L
Maple/Box Elder IgE: <0.1 kU/L
Mouse Urine IgE: <0.1 kU/L
Oak, White IgE: <0.1 kU/L
Pecan, Hickory IgE: <0.1 kU/L
Penicillium Chrysogen IgE: <0.1 kU/L
Pigweed, Rough IgE: <0.1 kU/L
Ragweed, Short IgE: <0.1 kU/L
Sheep Sorrel IgE Qn: <0.1 kU/L
Timothy Grass IgE: <0.1 kU/L
White Mulberry IgE: <0.1 kU/L

## 2019-05-03 ENCOUNTER — Encounter: Payer: Self-pay | Admitting: Sports Medicine

## 2019-05-07 ENCOUNTER — Other Ambulatory Visit: Payer: Self-pay

## 2019-05-07 ENCOUNTER — Other Ambulatory Visit: Payer: Self-pay | Admitting: Orthopedic Surgery

## 2019-05-07 ENCOUNTER — Other Ambulatory Visit: Payer: Self-pay | Admitting: Sports Medicine

## 2019-05-07 ENCOUNTER — Ambulatory Visit (INDEPENDENT_AMBULATORY_CARE_PROVIDER_SITE_OTHER): Payer: 59 | Admitting: Sports Medicine

## 2019-05-07 ENCOUNTER — Ambulatory Visit (INDEPENDENT_AMBULATORY_CARE_PROVIDER_SITE_OTHER): Payer: 59

## 2019-05-07 DIAGNOSIS — M779 Enthesopathy, unspecified: Secondary | ICD-10-CM

## 2019-05-07 DIAGNOSIS — M79672 Pain in left foot: Secondary | ICD-10-CM

## 2019-05-07 DIAGNOSIS — R58 Hemorrhage, not elsewhere classified: Secondary | ICD-10-CM

## 2019-05-07 DIAGNOSIS — M792 Neuralgia and neuritis, unspecified: Secondary | ICD-10-CM

## 2019-05-07 MED ORDER — TRIAMCINOLONE ACETONIDE 10 MG/ML IJ SUSP
10.0000 mg | Freq: Once | INTRAMUSCULAR | Status: AC
  Administered 2019-05-07: 10 mg

## 2019-05-07 MED ORDER — BACLOFEN 10 MG PO TABS: ORAL_TABLET | ORAL | 0 refills | Status: DC

## 2019-05-07 NOTE — Progress Notes (Signed)
Subjective: Terrace Chiem is a 28 y.o. female patient who presents to office for evaluation of left foot pain. Patient complains of progressive pain especially over the last few days since Sunday noticed some swelling redness and bruising along the plantar forefoot from the third through the fifth metatarsal head reports since then there has been a dull aching pain worst 5 out of 10 with a very pinpoint painful area that she can press and feel the sharp pain reports that there is numbness in her toes she is currently seeing neurology which they feel like it is from her back where there is a compression on the nerve at the level of the L5-S1 nerve root patient reports that she is also seeing pain management for her back as well and currently she occasionally has been taking ibuprofen for her foot pain and in her cam boot when it hurts really bad and has been icing and elevating.  Patient denies any known injury or trauma and denies any other acute symptoms at this time.  Patient reports that she just wants to have her surgical foot checked because she was concerned if anything happened to the screws that she has at her tailor's bunionectomy site.  Patient Active Problem List   Diagnosis Date Noted  . Abnormal MSAFP (maternal serum alpha-fetoprotein), elevated 11/05/2016  . Psychoactive substance use disorder 11/20/2014    Current Outpatient Medications on File Prior to Visit  Medication Sig Dispense Refill  . amoxicillin-clavulanate (AUGMENTIN) 875-125 MG tablet Take one tablet by mouth twice daily for ten days total. 20 tablet 0  . baclofen (LIORESAL) 10 MG tablet 1 po q 8 hrs 30 each 0  . benzocaine (HURRICAINE) 20 % GEL Apply to affected area four times daily if needed 14 g 0  . Biotin 5000 MCG TABS Take by mouth.    Marland Kitchen buPROPion (WELLBUTRIN) 75 MG tablet Take 75 mg by mouth daily.    . clonazePAM (KLONOPIN) 0.5 MG tablet TAKE 1 TABLET BY MOUTH ONCE DAILY AS NEEDED FOR ANXIETY    . dicyclomine  (BENTYL) 10 MG capsule TAKE 1 CAPSULE BY MOUTH TWICE DAILY AS NEEDED FOR DIARRHEA    . docusate sodium (COLACE) 100 MG capsule Take 1 capsule (100 mg total) by mouth daily as needed. 30 capsule 2  . escitalopram (LEXAPRO) 20 MG tablet Take 20 mg by mouth daily.    Marland Kitchen HYDROcodone-acetaminophen (NORCO) 10-325 MG tablet Take 1 tablet by mouth 4 (four) times daily as needed.    Marland Kitchen ibuprofen (ADVIL,MOTRIN) 800 MG tablet Take 1 tablet (800 mg total) by mouth every 8 (eight) hours as needed. 30 tablet 0  . ipratropium (ATROVENT) 0.06 % nasal spray Use two sprays in each nostril twice daily as directed. 45 mL 5  . LORazepam (ATIVAN) 0.5 MG tablet     . metFORMIN (GLUCOPHAGE) 500 MG tablet Take 250 mg by mouth daily with breakfast.    . methocarbamol (ROBAXIN) 500 MG tablet Take 1 tablet (500 mg total) by mouth every 8 (eight) hours as needed for muscle spasms. 30 tablet 0  . montelukast (SINGULAIR) 10 MG tablet Take 10 mg by mouth at bedtime.    . montelukast (SINGULAIR) 10 MG tablet Take 1 tablet (10 mg total) by mouth at bedtime. 90 tablet 1  . nortriptyline (PAMELOR) 25 MG capsule TAKE 1-2 CAPSULES BY MOUTH AT BEDTIME DAILY    . TURMERIC PO Take by mouth.     No current facility-administered medications on file prior  to visit.     No Known Allergies  Objective:  General: Alert and oriented x3 in no acute distress  Dermatology: No open lesions bilateral lower extremities, surgical scar well-healed left forefoot.  No webspace macerations, no ecchymosis bilateral, all nails x 10 are well manicured.  Vascular: Dorsalis Pedis and Posterior Tibial pedal pulses palpable, Capillary Fill Time 3 seconds,(+) pedal hair growth bilateral, no edema bilateral lower extremities, Temperature gradient within normal limits.  Neurology: Gross sensation intact via light touch bilateral, subjective numbness to toes.  Musculoskeletal: Mild tenderness with palpation at left foot at the intermetatarsal space between the  fourth and fifth metatarsal heads. Strength within normal limits in all groups bilateral.   Gait: Antalgic gait  Xrays  Left foot   Impression: Osteotomy site well healed.  Splayfoot type.  No other acute findings.  Assessment and Plan: Problem List Items Addressed This Visit    None    Visit Diagnoses    Capsulitis    -  Primary   Neuritis       Left foot pain       Ecchymosis           -Complete examination performed -Xrays reviewed -Discussed treatment options for capsulitis versus neuritis versus possible ligament sprain that is now much improved since ecchymosis has resolved -After oral consent and aseptic prep, injected a mixture containing 1 ml of 2%  plain lidocaine, 1 ml 0.5% plain marcaine, 0.5 ml of kenalog 10 and 0.5 ml of dexamethasone phosphate into the 4th intermetatarsal space left foot plantar approach without complication. Post-injection care discussed with patient.  -Advised patient to ice to prevent against bruising for 15 to 20 minutes today -Dispensed metatarsal sleeve padding at no additional charge -Advised patient to continue with good supportive shoes -Patient to return to office as needed or sooner if condition worsens.  Asencion Islam, DPM

## 2019-05-07 NOTE — Telephone Encounter (Signed)
Dean patient 

## 2019-05-07 NOTE — Telephone Encounter (Signed)
Ok to rf? 

## 2019-05-08 MED FILL — BACLOFEN 10 MG TABS: 10 | 10 days supply | Qty: 30 | Fill #0

## 2019-05-15 ENCOUNTER — Other Ambulatory Visit: Payer: Self-pay | Admitting: Sports Medicine

## 2019-05-15 DIAGNOSIS — M779 Enthesopathy, unspecified: Secondary | ICD-10-CM

## 2019-05-17 MED FILL — buPROPion HCL 75 MG TABS: 75 | 30 days supply | Qty: 30 | Fill #0

## 2019-05-24 DIAGNOSIS — Z3200 Encounter for pregnancy test, result unknown: Secondary | ICD-10-CM | POA: Diagnosis not present

## 2019-05-24 DIAGNOSIS — Z79899 Other long term (current) drug therapy: Secondary | ICD-10-CM | POA: Diagnosis not present

## 2019-05-24 DIAGNOSIS — M5136 Other intervertebral disc degeneration, lumbar region: Secondary | ICD-10-CM | POA: Diagnosis not present

## 2019-05-24 DIAGNOSIS — M792 Neuralgia and neuritis, unspecified: Secondary | ICD-10-CM | POA: Diagnosis not present

## 2019-05-24 DIAGNOSIS — Z79891 Long term (current) use of opiate analgesic: Secondary | ICD-10-CM | POA: Diagnosis not present

## 2019-05-24 DIAGNOSIS — F1120 Opioid dependence, uncomplicated: Secondary | ICD-10-CM | POA: Diagnosis not present

## 2019-05-25 DIAGNOSIS — E282 Polycystic ovarian syndrome: Secondary | ICD-10-CM | POA: Diagnosis not present

## 2019-05-25 DIAGNOSIS — Z6831 Body mass index (BMI) 31.0-31.9, adult: Secondary | ICD-10-CM | POA: Diagnosis not present

## 2019-05-25 DIAGNOSIS — Z79899 Other long term (current) drug therapy: Secondary | ICD-10-CM | POA: Diagnosis not present

## 2019-05-25 DIAGNOSIS — J4520 Mild intermittent asthma, uncomplicated: Secondary | ICD-10-CM | POA: Diagnosis not present

## 2019-05-25 DIAGNOSIS — F410 Panic disorder [episodic paroxysmal anxiety] without agoraphobia: Secondary | ICD-10-CM | POA: Diagnosis not present

## 2019-05-25 DIAGNOSIS — F419 Anxiety disorder, unspecified: Secondary | ICD-10-CM | POA: Diagnosis not present

## 2019-05-25 MED FILL — NORTRIPTYLINE HCL 50 MG CAP: 50 | 30 days supply | Qty: 30 | Fill #0

## 2019-05-25 MED FILL — SPIRONOLACTONE 50 MG TABLET: 50 | 30 days supply | Qty: 30 | Fill #0

## 2019-05-25 MED FILL — ALBUTEROL SULFATE HFA 108 (: 108 (90 BAS | 30 days supply | Qty: 9 | Fill #0

## 2019-05-25 MED FILL — ESCITALOPRAM 20 MG TABLET: 20 | 90 days supply | Qty: 90 | Fill #0

## 2019-05-25 MED FILL — GABAPENTIN 100 MG CAPSULE: 100 | 30 days supply | Qty: 180 | Fill #0

## 2019-05-25 MED FILL — metFORMIN HCL 500 MG TABS: 500 | 90 days supply | Qty: 90 | Fill #0

## 2019-05-26 ENCOUNTER — Other Ambulatory Visit: Payer: Self-pay

## 2019-05-26 MED ORDER — MONTELUKAST SODIUM 10 MG PO TABS: 10.0000 mg | ORAL_TABLET | Freq: Every day | ORAL | 1 refills | Status: DC

## 2019-05-26 MED FILL — MONTELUKAST SOD 10 MG TAB: 10 | 90 days supply | Qty: 90 | Fill #0

## 2019-06-21 DIAGNOSIS — Z79899 Other long term (current) drug therapy: Secondary | ICD-10-CM | POA: Diagnosis not present

## 2019-06-21 DIAGNOSIS — M792 Neuralgia and neuritis, unspecified: Secondary | ICD-10-CM | POA: Diagnosis not present

## 2019-06-21 DIAGNOSIS — Z3200 Encounter for pregnancy test, result unknown: Secondary | ICD-10-CM | POA: Diagnosis not present

## 2019-06-21 DIAGNOSIS — M5136 Other intervertebral disc degeneration, lumbar region: Secondary | ICD-10-CM | POA: Diagnosis not present

## 2019-06-21 DIAGNOSIS — F1120 Opioid dependence, uncomplicated: Secondary | ICD-10-CM | POA: Diagnosis not present

## 2019-06-21 DIAGNOSIS — Z79891 Long term (current) use of opiate analgesic: Secondary | ICD-10-CM | POA: Diagnosis not present

## 2019-06-22 MED FILL — NORTRIPTYLINE HCL 50 MG CAP: 50 | 30 days supply | Qty: 30 | Fill #1

## 2019-06-22 MED FILL — GABAPENTIN 100 MG CAPSULE: 100 | 30 days supply | Qty: 180 | Fill #1

## 2019-06-22 MED FILL — HYDROCODON-APAP 10-325: 10-325 | 27 days supply | Qty: 135 | Fill #0

## 2019-06-22 MED FILL — SPIRONOLACTONE 50 MG TABLET: 50 | 30 days supply | Qty: 30 | Fill #1

## 2019-07-02 MED FILL — BUPROPION HCL XL 150 MG TAB: 150 | 90 days supply | Qty: 90 | Fill #0

## 2019-07-07 DIAGNOSIS — E669 Obesity, unspecified: Secondary | ICD-10-CM | POA: Diagnosis not present

## 2019-07-07 DIAGNOSIS — Z6832 Body mass index (BMI) 32.0-32.9, adult: Secondary | ICD-10-CM | POA: Diagnosis not present

## 2019-07-07 DIAGNOSIS — E282 Polycystic ovarian syndrome: Secondary | ICD-10-CM | POA: Diagnosis not present

## 2019-07-07 DIAGNOSIS — R5383 Other fatigue: Secondary | ICD-10-CM | POA: Diagnosis not present

## 2019-07-07 DIAGNOSIS — Z79899 Other long term (current) drug therapy: Secondary | ICD-10-CM | POA: Diagnosis not present

## 2019-07-07 MED FILL — PHENTERMINE 37.5 MG TABLET: 37.5 | 30 days supply | Qty: 30 | Fill #0

## 2019-07-18 DIAGNOSIS — F1120 Opioid dependence, uncomplicated: Secondary | ICD-10-CM | POA: Diagnosis not present

## 2019-07-18 DIAGNOSIS — Z79891 Long term (current) use of opiate analgesic: Secondary | ICD-10-CM | POA: Diagnosis not present

## 2019-07-18 DIAGNOSIS — M5136 Other intervertebral disc degeneration, lumbar region: Secondary | ICD-10-CM | POA: Diagnosis not present

## 2019-07-18 DIAGNOSIS — M792 Neuralgia and neuritis, unspecified: Secondary | ICD-10-CM | POA: Diagnosis not present

## 2019-07-18 DIAGNOSIS — Z3200 Encounter for pregnancy test, result unknown: Secondary | ICD-10-CM | POA: Diagnosis not present

## 2019-07-18 DIAGNOSIS — Z79899 Other long term (current) drug therapy: Secondary | ICD-10-CM | POA: Diagnosis not present

## 2019-07-20 MED FILL — SPIRONOLACTONE 50 MG TABLET: 50 | 90 days supply | Qty: 90 | Fill #0

## 2019-07-21 MED FILL — GABAPENTIN 100 MG CAPSULE: 100 | 30 days supply | Qty: 180 | Fill #2

## 2019-07-21 MED FILL — NORTRIPTYLINE HCL 50 MG CAP: 50 | 30 days supply | Qty: 30 | Fill #2

## 2019-07-31 MED FILL — GABAPENTIN 100 MG CAPSULE: 100 | 30 days supply | Qty: 180 | Fill #2

## 2019-07-31 MED FILL — NORTRIPTYLINE HCL 50 MG CAP: 50 | 30 days supply | Qty: 30 | Fill #2

## 2019-08-06 MED FILL — PHENTERMINE 37.5 MG TABLET: 37.5 | 30 days supply | Qty: 30 | Fill #0

## 2019-08-12 IMAGING — MR MR SACRUM / SI JOINTS WO CM
4 of 5 series · 32 of 48 positions shown · non-contrast
Comparison: None.

CLINICAL DATA: Initial evaluation for chronic mechanical back pain.

EXAM:
MRI LUMBAR SPINE AND SACRUM WITHOUT CONTRAST
TECHNIQUE: Multiplanar, multisequence MR imaging of the lumbar spine was
performed. No intravenous contrast was administered.

[Series 2: T1 · axial · right · 4.0mm · 0.52mm/px · z∈[-61,+99]mm · 11 of 33 slices shown (1 of 2)]
[im 1/33]
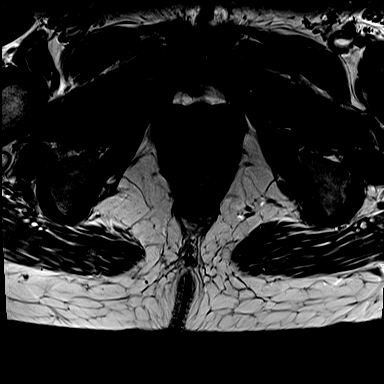
[im 4/33]
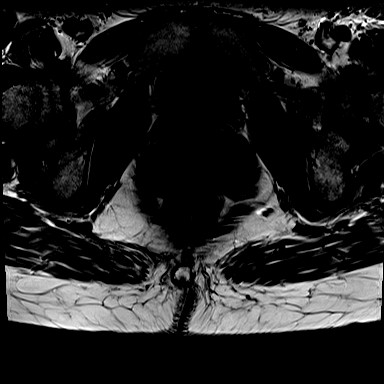
[im 7/33]
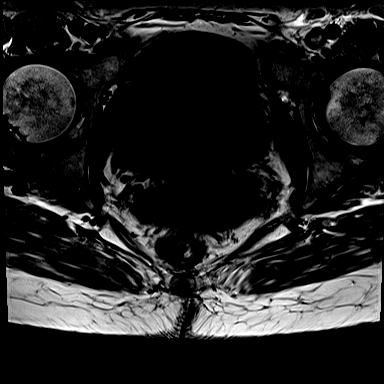
[im 10/33]
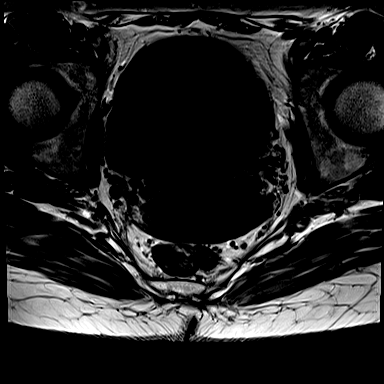
[im 13/33]
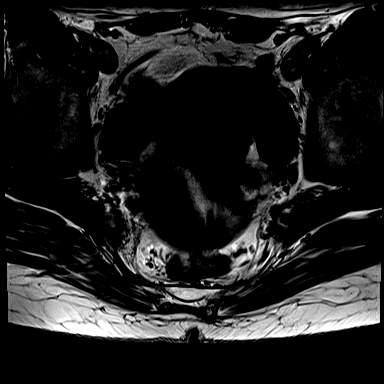
[im 17/33]
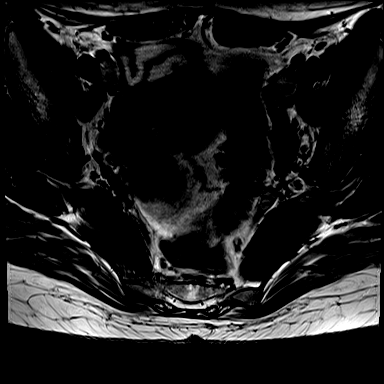
[im 20/33]
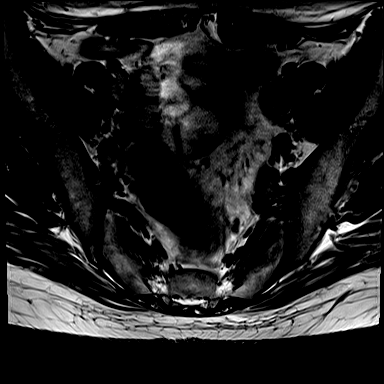
[im 23/33]
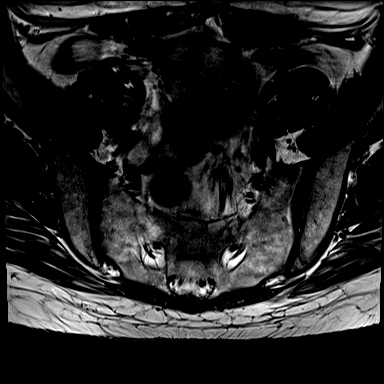
[im 26/33]
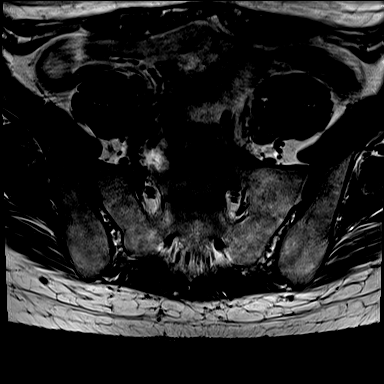
[im 29/33]
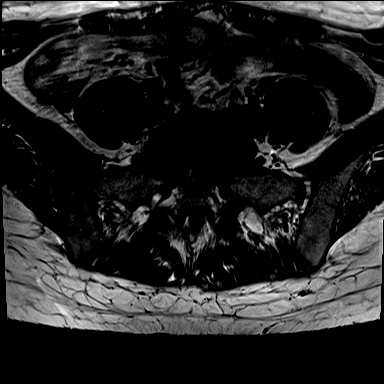
[im 33/33]
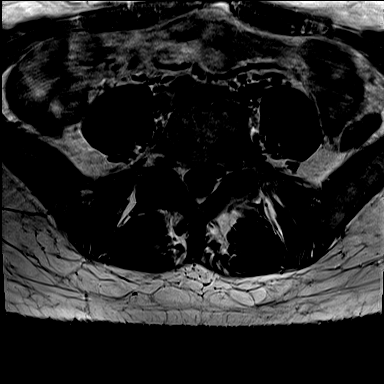

[Series 3: T2 fat-sat · axial · right · 4.0mm · 0.52mm/px · z∈[-68,+106]mm · 8 of 32 slices shown (1 of 2)]
[im 1/32]
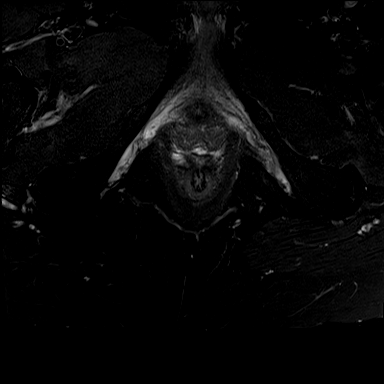
[im 4/32]
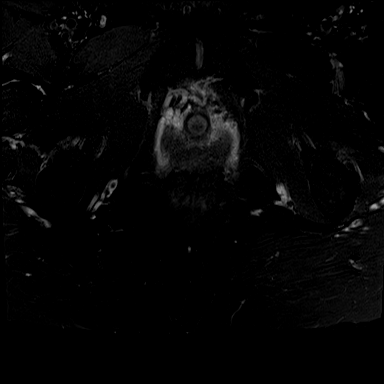
[im 11/32]
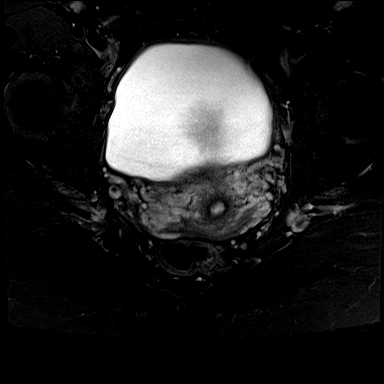
[im 14/32]
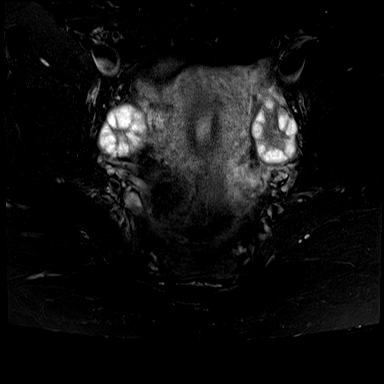
[im 18/32]
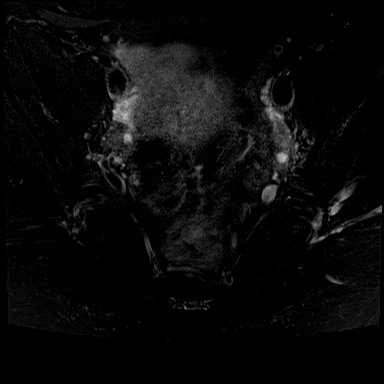
[im 21/32]
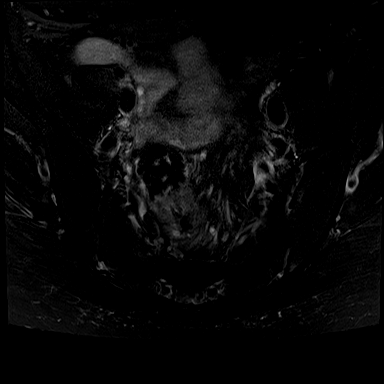
[im 28/32]
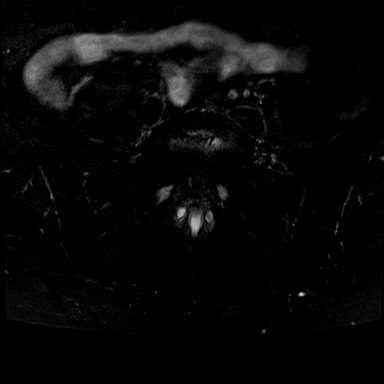
[im 32/32]
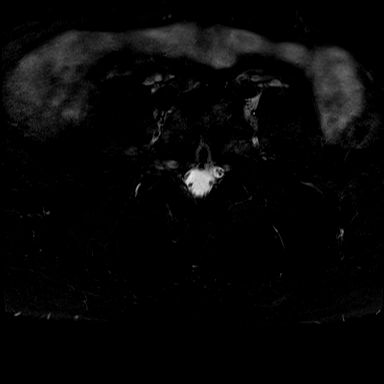

[Series 4: T2 fat-sat · sagittal · right · 4.0mm · 0.75mm/px · 10 of 34 slices shown (2 of 2)]
[im 1/34]
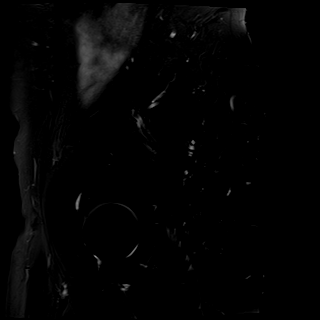
[im 4/34]
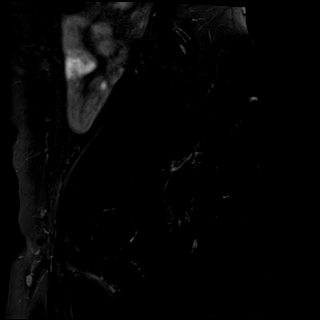
[im 7/34]
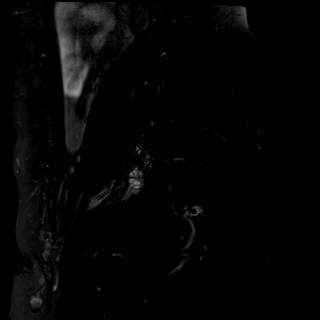
[im 10/34]
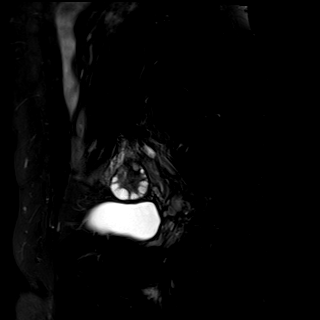
[im 14/34]
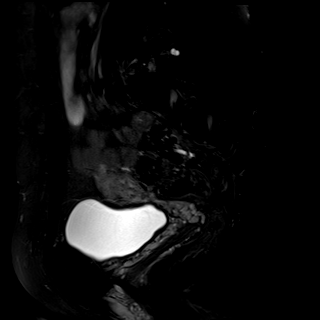
[im 17/34]
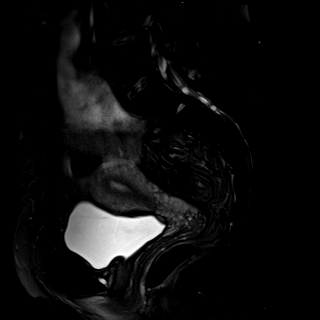
[im 20/34]
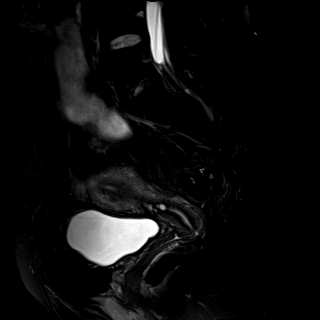
[im 24/34]
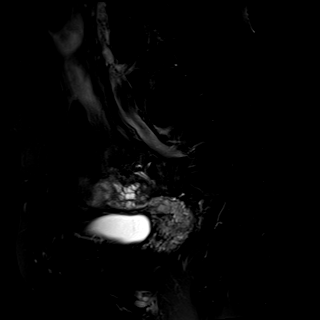
[im 27/34]
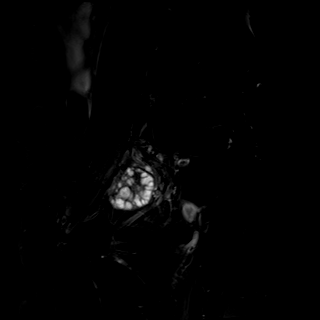
[im 30/34]
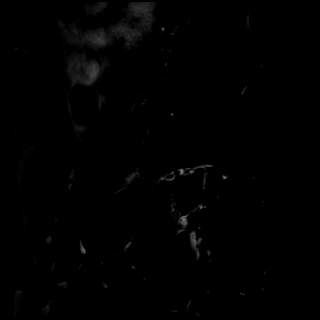

[Series 5: T1 · coronal · right · 4.0mm · 0.49mm/px · 3 of 25 slices shown (2 of 2)]
[im 4/25]
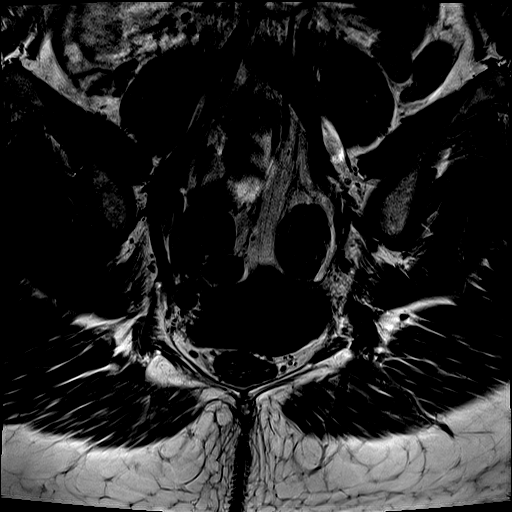
[im 14/25]
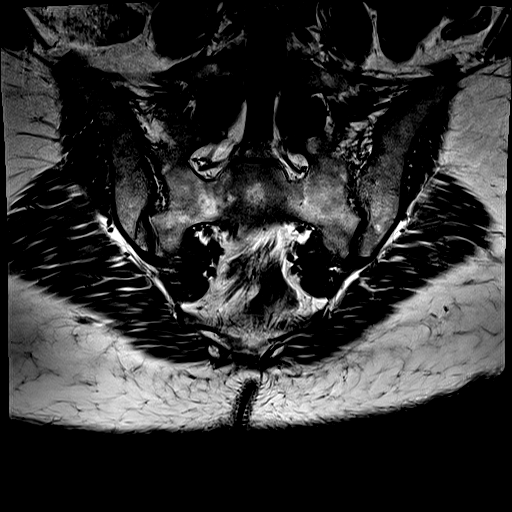
[im 21/25]
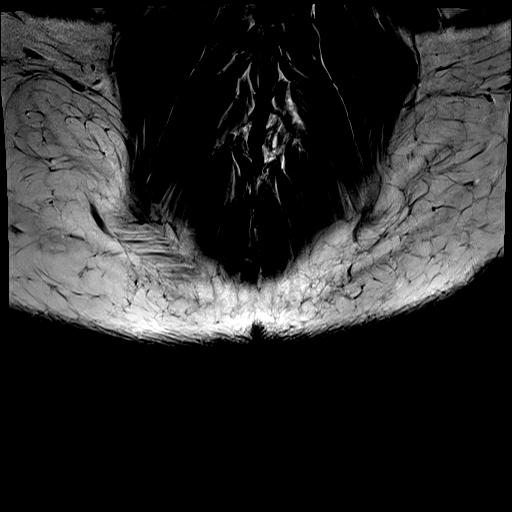

[32 of 48 positions shown; findings below may reference images not displayed]

FINDINGS: MRI LUMBAR SPINE FINDINGS

Segmentation: Standard. Lowest well-formed disc labeled the L5-S1
level.

Alignment: Mild levoscoliosis. Alignment otherwise normal with
preservation of the normal lumbar lordosis. No listhesis or
malalignment.

Vertebrae: Vertebral body heights well maintained without evidence
for acute or chronic fracture. Bone marrow signal intensity within
normal limits. No discrete or worrisome osseous lesions. No abnormal
marrow edema.

Conus medullaris and cauda equina: Conus extends to the L1 level.
Conus and cauda equina appear normal.

Paraspinal and other soft tissues: Paraspinous soft tissues within
normal limits. Visualized visceral structures unremarkable.

Disc levels:

L1-2:  Unremarkable.

L2-3:  Unremarkable.

L3-4: Disc desiccation with minimal annular disc bulge. No canal or
foraminal stenosis.

L4-5: Normal interspace. Mild bilateral facet hypertrophy. No canal
or neural foraminal stenosis.

L5-S1: Mild disc desiccation. There is a subtle tiny left foraminal
disc protrusion, contacting the exiting left L5 nerve root as it
courses out of the left neural foramen (series 7, image 11).
Associated annular fissure. Probable partially conjoined L5 and S1
nerve roots noted. Mild bilateral facet hypertrophy. Resultant mild
left lateral recess and foraminal narrowing. Central canal remains
widely patent.

MRI SACRUM FINDINGS

Visualized bone marrow demonstrates a normal signal intensity with
normal appearance. No discrete or worrisome osseous lesions. SI
joints well approximated and symmetric bilaterally. No marrow edema
or erosive changes to suggest sacroiliitis. Partially visualized
hips without abnormality. Pubis symphysis unremarkable.

Visualized soft tissues and musculature within normal limits.

Partially visualized bowel unremarkable. Few scattered nabothian
cyst noted at the cervix. Mildly enlarged ovaries with increased
number of peripherally located follicles, compatible with history of
PCOS. Visualized visceral structures otherwise unremarkable. Normal
vascular flow void seen throughout the pelvis. No free fluid or
adenopathy.
IMPRESSION: MRI LUMBAR SPINE IMPRESSION

1. Subtle small left foraminal disc protrusion at L5-S1, contacting
and potentially affecting the exiting left L5 nerve root in the left
neural foramen. Associated mild left foraminal and left lateral
recess stenosis.
2. Mild noncompressive disc bulging at L3-4 without stenosis or
impingement.
3. Minimal bilateral facet hypertrophy at L4-5 and L5-S1.

MRI SACRUM IMPRESSION

1. Normal MRI of the sacrum. No evidence for sacroiliitis or other
abnormality.
2. Findings compatible with PCOS involving the ovaries bilaterally.

## 2019-08-15 DIAGNOSIS — Z79891 Long term (current) use of opiate analgesic: Secondary | ICD-10-CM | POA: Diagnosis not present

## 2019-08-15 DIAGNOSIS — M5136 Other intervertebral disc degeneration, lumbar region: Secondary | ICD-10-CM | POA: Diagnosis not present

## 2019-08-15 DIAGNOSIS — M47816 Spondylosis without myelopathy or radiculopathy, lumbar region: Secondary | ICD-10-CM | POA: Diagnosis not present

## 2019-08-15 DIAGNOSIS — F1120 Opioid dependence, uncomplicated: Secondary | ICD-10-CM | POA: Diagnosis not present

## 2019-08-19 DIAGNOSIS — E669 Obesity, unspecified: Secondary | ICD-10-CM | POA: Diagnosis not present

## 2019-08-19 DIAGNOSIS — F5081 Binge eating disorder: Secondary | ICD-10-CM | POA: Diagnosis not present

## 2019-08-19 DIAGNOSIS — Z6832 Body mass index (BMI) 32.0-32.9, adult: Secondary | ICD-10-CM | POA: Diagnosis not present

## 2019-08-19 MED FILL — metFORMIN HCL 500 MG TABS: 500 | 90 days supply | Qty: 180 | Fill #0

## 2019-08-19 MED FILL — TOPIRAMATE 25 MG TAB: 25 | 30 days supply | Qty: 60 | Fill #0

## 2019-08-28 DIAGNOSIS — M5136 Other intervertebral disc degeneration, lumbar region: Secondary | ICD-10-CM | POA: Diagnosis not present

## 2019-08-28 DIAGNOSIS — Z3200 Encounter for pregnancy test, result unknown: Secondary | ICD-10-CM | POA: Diagnosis not present

## 2019-08-28 DIAGNOSIS — F1120 Opioid dependence, uncomplicated: Secondary | ICD-10-CM | POA: Diagnosis not present

## 2019-08-28 DIAGNOSIS — Z79891 Long term (current) use of opiate analgesic: Secondary | ICD-10-CM | POA: Diagnosis not present

## 2019-08-28 DIAGNOSIS — Z79899 Other long term (current) drug therapy: Secondary | ICD-10-CM | POA: Diagnosis not present

## 2019-08-28 MED FILL — MONTELUKAST SOD 10 MG TAB: 10 | 90 days supply | Qty: 90 | Fill #1

## 2019-08-28 MED FILL — ESCITALOPRAM 20 MG TABLET: 20 | 90 days supply | Qty: 90 | Fill #0

## 2019-08-28 MED FILL — NORTRIPTYLINE HCL 50 MG CAP: 50 | 30 days supply | Qty: 30 | Fill #0

## 2019-08-28 MED FILL — HYDROCODON-APAP 10-325: 10-325 | 27 days supply | Qty: 135 | Fill #0

## 2019-08-28 MED FILL — GABAPENTIN 100 MG CAPSULE: 100 | 30 days supply | Qty: 180 | Fill #3

## 2019-09-23 MED FILL — CONCERTA 18 MG TABLET ER: 18 | 30 days supply | Qty: 30 | Fill #0

## 2019-09-24 DIAGNOSIS — Z3200 Encounter for pregnancy test, result unknown: Secondary | ICD-10-CM | POA: Diagnosis not present

## 2019-09-24 DIAGNOSIS — Z79899 Other long term (current) drug therapy: Secondary | ICD-10-CM | POA: Diagnosis not present

## 2019-09-24 DIAGNOSIS — M792 Neuralgia and neuritis, unspecified: Secondary | ICD-10-CM | POA: Diagnosis not present

## 2019-09-24 DIAGNOSIS — F1120 Opioid dependence, uncomplicated: Secondary | ICD-10-CM | POA: Diagnosis not present

## 2019-09-24 DIAGNOSIS — Z79891 Long term (current) use of opiate analgesic: Secondary | ICD-10-CM | POA: Diagnosis not present

## 2019-09-24 DIAGNOSIS — M5136 Other intervertebral disc degeneration, lumbar region: Secondary | ICD-10-CM | POA: Diagnosis not present

## 2019-09-24 MED FILL — OXYCODONE-ACETAMINOPHEN 10-: 10-325 | 30 days supply | Qty: 90 | Fill #0

## 2019-09-30 DIAGNOSIS — M792 Neuralgia and neuritis, unspecified: Secondary | ICD-10-CM | POA: Diagnosis not present

## 2019-09-30 DIAGNOSIS — M545 Low back pain: Secondary | ICD-10-CM | POA: Diagnosis not present

## 2019-09-30 DIAGNOSIS — G8929 Other chronic pain: Secondary | ICD-10-CM | POA: Diagnosis not present

## 2019-10-02 ENCOUNTER — Telehealth: Payer: Self-pay

## 2019-10-02 MED FILL — AZELASTINE 0.15% NASAL SPRY: 0.15 | 25 days supply | Qty: 30 | Fill #0

## 2019-10-02 MED FILL — TOPIRAMATE 50 MG TABLET: 50 | 30 days supply | Qty: 60 | Fill #0

## 2019-10-02 NOTE — Telephone Encounter (Signed)
Per MD instruction prescription for azelastine 0.15% 2 sprays in each nostril bid has been phoned in to [facility].

## 2019-10-13 DIAGNOSIS — E0590 Thyrotoxicosis, unspecified without thyrotoxic crisis or storm: Secondary | ICD-10-CM | POA: Diagnosis not present

## 2019-10-13 DIAGNOSIS — N949 Unspecified condition associated with female genital organs and menstrual cycle: Secondary | ICD-10-CM | POA: Diagnosis not present

## 2019-10-13 DIAGNOSIS — R112 Nausea with vomiting, unspecified: Secondary | ICD-10-CM | POA: Diagnosis not present

## 2019-10-13 DIAGNOSIS — R300 Dysuria: Secondary | ICD-10-CM | POA: Diagnosis not present

## 2019-10-13 DIAGNOSIS — F5081 Binge eating disorder: Secondary | ICD-10-CM | POA: Diagnosis not present

## 2019-10-13 DIAGNOSIS — M545 Low back pain: Secondary | ICD-10-CM | POA: Diagnosis not present

## 2019-10-16 MED FILL — NORTRIPTYLINE HCL 50 MG CAP: 50 | 30 days supply | Qty: 30 | Fill #1

## 2019-10-16 MED FILL — CONCERTA 27 MG TABLET ER: 27 | 30 days supply | Qty: 30 | Fill #0

## 2019-10-16 MED FILL — SPIRONOLACTONE 50 MG TABLET: 50 | 90 days supply | Qty: 90 | Fill #1

## 2019-10-23 DIAGNOSIS — Z3200 Encounter for pregnancy test, result unknown: Secondary | ICD-10-CM | POA: Diagnosis not present

## 2019-10-23 DIAGNOSIS — M5136 Other intervertebral disc degeneration, lumbar region: Secondary | ICD-10-CM | POA: Diagnosis not present

## 2019-10-23 DIAGNOSIS — Z79891 Long term (current) use of opiate analgesic: Secondary | ICD-10-CM | POA: Diagnosis not present

## 2019-10-23 DIAGNOSIS — Z79899 Other long term (current) drug therapy: Secondary | ICD-10-CM | POA: Diagnosis not present

## 2019-10-23 DIAGNOSIS — R300 Dysuria: Secondary | ICD-10-CM | POA: Diagnosis not present

## 2019-10-23 MED FILL — metroNIDAZOLE 500 MG TABS: 500 | 7 days supply | Qty: 14 | Fill #0

## 2019-10-23 MED FILL — OXYCODONE-ACETAMINOPHEN 10-: 10-325 | 30 days supply | Qty: 120 | Fill #0

## 2019-10-26 MED FILL — GABAPENTIN 300 MG CAPSULE: 300 | 30 days supply | Qty: 90 | Fill #0

## 2019-10-27 ENCOUNTER — Other Ambulatory Visit (HOSPITAL_COMMUNITY): Payer: Self-pay | Admitting: Family

## 2019-11-10 MED FILL — GABAPENTIN 300 MG CAPSULE: 300 | 30 days supply | Qty: 90 | Fill #0

## 2019-11-10 MED FILL — TIZANIDINE HCL 2 MG CAPS: 2 | 30 days supply | Qty: 90 | Fill #0

## 2019-11-11 DIAGNOSIS — F5101 Primary insomnia: Secondary | ICD-10-CM | POA: Diagnosis not present

## 2019-11-19 DIAGNOSIS — M5136 Other intervertebral disc degeneration, lumbar region: Secondary | ICD-10-CM | POA: Diagnosis not present

## 2019-11-19 DIAGNOSIS — F1120 Opioid dependence, uncomplicated: Secondary | ICD-10-CM | POA: Diagnosis not present

## 2019-11-19 DIAGNOSIS — Z79891 Long term (current) use of opiate analgesic: Secondary | ICD-10-CM | POA: Diagnosis not present

## 2019-11-19 DIAGNOSIS — Z3200 Encounter for pregnancy test, result unknown: Secondary | ICD-10-CM | POA: Diagnosis not present

## 2019-11-19 DIAGNOSIS — Z79899 Other long term (current) drug therapy: Secondary | ICD-10-CM | POA: Diagnosis not present

## 2019-11-19 MED FILL — NORTRIPTYLINE HCL 50 MG CAP: 50 | 30 days supply | Qty: 30 | Fill #2

## 2019-11-19 MED FILL — OXYCODONE-ACETAMINOPHEN 10-: 10-325 | 30 days supply | Qty: 120 | Fill #0

## 2019-11-19 MED FILL — TOPIRAMATE 50 MG TABLET: 50 | 30 days supply | Qty: 60 | Fill #1

## 2019-11-26 DIAGNOSIS — B349 Viral infection, unspecified: Secondary | ICD-10-CM | POA: Diagnosis not present

## 2019-11-26 DIAGNOSIS — R0981 Nasal congestion: Secondary | ICD-10-CM | POA: Diagnosis not present

## 2019-11-27 ENCOUNTER — Other Ambulatory Visit: Payer: Self-pay | Admitting: Sports Medicine

## 2019-11-27 ENCOUNTER — Other Ambulatory Visit: Payer: Self-pay

## 2019-11-27 ENCOUNTER — Ambulatory Visit (INDEPENDENT_AMBULATORY_CARE_PROVIDER_SITE_OTHER): Payer: 59 | Admitting: Sports Medicine

## 2019-11-27 ENCOUNTER — Ambulatory Visit (INDEPENDENT_AMBULATORY_CARE_PROVIDER_SITE_OTHER): Payer: 59

## 2019-11-27 ENCOUNTER — Encounter: Payer: Self-pay | Admitting: Sports Medicine

## 2019-11-27 DIAGNOSIS — M79671 Pain in right foot: Secondary | ICD-10-CM

## 2019-11-27 DIAGNOSIS — M779 Enthesopathy, unspecified: Secondary | ICD-10-CM | POA: Diagnosis not present

## 2019-11-27 DIAGNOSIS — M7752 Other enthesopathy of left foot: Secondary | ICD-10-CM

## 2019-11-27 DIAGNOSIS — M792 Neuralgia and neuritis, unspecified: Secondary | ICD-10-CM

## 2019-11-27 DIAGNOSIS — M79672 Pain in left foot: Secondary | ICD-10-CM

## 2019-11-27 DIAGNOSIS — M21621 Bunionette of right foot: Secondary | ICD-10-CM

## 2019-11-27 DIAGNOSIS — M7751 Other enthesopathy of right foot: Secondary | ICD-10-CM

## 2019-11-27 MED ORDER — TRIAMCINOLONE ACETONIDE 10 MG/ML IJ SUSP
10.0000 mg | Freq: Once | INTRAMUSCULAR | Status: AC
  Administered 2019-11-27: 10 mg

## 2019-11-27 MED FILL — METFORMIN HCL 500 MG TABS: 500 | 90 days supply | Qty: 180 | Fill #0

## 2019-11-27 MED FILL — ESCITALOPRAM 20 MG TABLET: 20 | 90 days supply | Qty: 90 | Fill #0

## 2019-11-27 NOTE — Progress Notes (Signed)
Subjective: Brianna Martin is a 29 y.o. female patient who presents to office for evaluation of right greater than left foot pain. Patient complains of progressive pain at the fifth toe joint reports that pain is worse on the right 8 out of 10 after work for swelling feels like she is walking on a marble reports that she has tried icing and changing shoes without any complete relief.  Patient reports that she is still struggling with lumbar issues and has a bulging disc and increased numbness and tingling and burning on the left side of her body including the left lower extremity due to her lumbar problem.  Patient denies any other pedal complaints at this time.  Patient Active Problem List   Diagnosis Date Noted  . Abnormal MSAFP (maternal serum alpha-fetoprotein), elevated 11/05/2016  . Psychoactive substance use disorder 11/20/2014    Current Outpatient Medications on File Prior to Visit  Medication Sig Dispense Refill  . baclofen (LIORESAL) 10 MG tablet 1 po q 8 hrs 30 each 0  . benzocaine (HURRICAINE) 20 % GEL Apply to affected area four times daily if needed 14 g 0  . Biotin 5000 MCG TABS Take by mouth.    Marland Kitchen buPROPion (WELLBUTRIN) 75 MG tablet Take 75 mg by mouth daily.    . clonazePAM (KLONOPIN) 0.5 MG tablet TAKE 1 TABLET BY MOUTH ONCE DAILY AS NEEDED FOR ANXIETY    . dicyclomine (BENTYL) 10 MG capsule TAKE 1 CAPSULE BY MOUTH TWICE DAILY AS NEEDED FOR DIARRHEA    . escitalopram (LEXAPRO) 20 MG tablet Take 20 mg by mouth daily.    Marland Kitchen gabapentin (NEURONTIN) 300 MG capsule Take 300 mg by mouth 3 (three) times daily.    Marland Kitchen HYDROcodone-acetaminophen (NORCO) 10-325 MG tablet Take 1 tablet by mouth 4 (four) times daily as needed.    Marland Kitchen ibuprofen (ADVIL,MOTRIN) 800 MG tablet Take 1 tablet (800 mg total) by mouth every 8 (eight) hours as needed. 30 tablet 0  . ipratropium (ATROVENT) 0.06 % nasal spray Use two sprays in each nostril twice daily as directed. 45 mL 5  . LORazepam (ATIVAN) 0.5 MG  tablet     . metFORMIN (GLUCOPHAGE) 500 MG tablet Take 250 mg by mouth daily with breakfast.    . methocarbamol (ROBAXIN) 500 MG tablet Take 1 tablet (500 mg total) by mouth every 8 (eight) hours as needed for muscle spasms. 30 tablet 0  . montelukast (SINGULAIR) 10 MG tablet Take 1 tablet (10 mg total) by mouth at bedtime. 90 tablet 1  . nortriptyline (PAMELOR) 25 MG capsule TAKE 1-2 CAPSULES BY MOUTH AT BEDTIME DAILY    . TURMERIC PO Take by mouth.     No current facility-administered medications on file prior to visit.    No Known Allergies  Objective:  General: Alert and oriented x3 in no acute distress  Dermatology: No open lesions bilateral lower extremities, surgical scar well-healed left fifth metatarsophalangeal joint no webspace macerations, no ecchymosis bilateral, soft tissue fullness noted at the fifth metatarsophalangeal joints bilateral, all nails x 10 are well manicured.  Vascular: Dorsalis Pedis and Posterior Tibial pedal pulses palpable, Capillary Fill Time 3 seconds,(+) pedal hair growth bilateral, no edema bilateral lower extremities, Temperature gradient within normal limits.  Neurology: Gross sensation intact via light touch bilateral, subjective numbness to toes left greater than right.  Musculoskeletal: Mild tenderness with palpation at right greater than left foot at fifth metatarsal head at area of soft tissue fullness. Strength within normal limits  in all groups bilateral.    Xrays  Left foot   Impression: Osteotomy site well healed.  Splayfoot type.  No other acute findings. X-rays Right foot Impression: Increased fourth fifth intermetatarsal angle supportive of tailors bunion deformity, moderate soft tissue swelling around the fifth metatarsophalangeal joint, splay foot type, no other acute findings.  Assessment and Plan: Problem List Items Addressed This Visit    None    Visit Diagnoses    Capsulitis    -  Primary   Bursitis of both feet        Neuritis       Left foot pain       Right foot pain       Tailor's bunionette, right           -Complete examination performed -Xrays reviewed -Discussed treatment options for bursitis and capsulitis with neuritis -After oral consent and aseptic prep, injected a mixture containing 1 ml of 2%  plain lidocaine, 1 ml 0.5% plain marcaine, 0.5 ml of kenalog 10 and 0.5 ml of dexamethasone phosphate into the fifth metatarsophalangeal joints bilateral via plantar approach without complication. Post-injection care discussed with patient.  -Advised patient to ice as instructed -Recommend good supportive shoes daily and advised patient that she would benefit from custom functional foot orthotics to help further offload the fifth metatarsophalangeal joints: Office to check benefits and to contact patient to schedule she wants to proceed with getting them -Patient to return to office as scheduled or sooner if condition worsens.  Landis Martins, DPM

## 2019-11-28 ENCOUNTER — Other Ambulatory Visit: Payer: Self-pay | Admitting: Sports Medicine

## 2019-11-30 MED ORDER — IBUPROFEN 800 MG PO TABS: 800.0000 mg | ORAL_TABLET | Freq: Three times a day (TID) | ORAL | 0 refills | Status: AC | PRN

## 2019-12-17 ENCOUNTER — Other Ambulatory Visit (HOSPITAL_COMMUNITY): Payer: Self-pay | Admitting: Physician Assistant

## 2019-12-17 DIAGNOSIS — M5136 Other intervertebral disc degeneration, lumbar region: Secondary | ICD-10-CM | POA: Diagnosis not present

## 2019-12-17 DIAGNOSIS — Z79899 Other long term (current) drug therapy: Secondary | ICD-10-CM | POA: Diagnosis not present

## 2019-12-17 DIAGNOSIS — Z3200 Encounter for pregnancy test, result unknown: Secondary | ICD-10-CM | POA: Diagnosis not present

## 2019-12-17 DIAGNOSIS — I868 Varicose veins of other specified sites: Secondary | ICD-10-CM | POA: Diagnosis not present

## 2019-12-17 DIAGNOSIS — F1120 Opioid dependence, uncomplicated: Secondary | ICD-10-CM | POA: Diagnosis not present

## 2019-12-21 ENCOUNTER — Telehealth: Payer: Self-pay | Admitting: Allergy

## 2019-12-21 ENCOUNTER — Other Ambulatory Visit: Payer: Self-pay

## 2019-12-21 MED ORDER — MONTELUKAST SODIUM 10 MG PO TABS: 10.0000 mg | ORAL_TABLET | Freq: Every day | ORAL | 0 refills | Status: AC

## 2019-12-21 MED FILL — MONTELUKAST SOD 10 MG TAB: 10 | 90 days supply | Qty: 90 | Fill #0

## 2019-12-21 MED FILL — TOPIRAMATE 25 MG TABLET: 25 | 30 days supply | Qty: 60 | Fill #0

## 2019-12-21 MED FILL — NORTRIPTYLINE HCL 50 MG CAP: 50 | 30 days supply | Qty: 30 | Fill #0

## 2019-12-21 NOTE — Telephone Encounter (Signed)
ERX sent to [facility] as requested.

## 2019-12-21 NOTE — Telephone Encounter (Signed)
Patient called in and requested refill of Montelukast sent in to [facility] outpatient pharmacy.

## 2019-12-29 ENCOUNTER — Other Ambulatory Visit: Payer: Self-pay

## 2019-12-29 ENCOUNTER — Other Ambulatory Visit: Payer: 59 | Admitting: Orthotics

## 2019-12-29 ENCOUNTER — Encounter: Payer: Self-pay | Admitting: Sports Medicine

## 2019-12-29 DIAGNOSIS — M7751 Other enthesopathy of right foot: Secondary | ICD-10-CM | POA: Diagnosis not present

## 2019-12-29 DIAGNOSIS — M7752 Other enthesopathy of left foot: Secondary | ICD-10-CM | POA: Diagnosis not present

## 2019-12-30 MED FILL — TOPIRAMATE 50 MG TABLET: 50 | 30 days supply | Qty: 60 | Fill #0

## 2019-12-31 ENCOUNTER — Telehealth: Payer: Self-pay | Admitting: Orthotics

## 2020-01-13 ENCOUNTER — Other Ambulatory Visit: Payer: Self-pay | Admitting: *Deleted

## 2020-01-13 DIAGNOSIS — I83893 Varicose veins of bilateral lower extremities with other complications: Secondary | ICD-10-CM

## 2020-01-15 ENCOUNTER — Other Ambulatory Visit: Payer: Self-pay

## 2020-01-15 ENCOUNTER — Ambulatory Visit (INDEPENDENT_AMBULATORY_CARE_PROVIDER_SITE_OTHER): Payer: 59 | Admitting: Physician Assistant

## 2020-01-15 ENCOUNTER — Ambulatory Visit (HOSPITAL_COMMUNITY)
Admission: RE | Admit: 2020-01-15 | Discharge: 2020-01-15 | Disposition: A | Discharge status: Home / Self Care | Payer: 59 | Source: Ambulatory Visit | Attending: Vascular Surgery | Admitting: Vascular Surgery

## 2020-01-15 VITALS — BP 107/82 | HR 104 | Temp 98.0°F | Resp 20 | Ht 62.0 in | Wt 201.9 lb

## 2020-01-15 DIAGNOSIS — J329 Chronic sinusitis, unspecified: Secondary | ICD-10-CM | POA: Diagnosis not present

## 2020-01-15 DIAGNOSIS — Z3200 Encounter for pregnancy test, result unknown: Secondary | ICD-10-CM | POA: Diagnosis not present

## 2020-01-15 DIAGNOSIS — M5136 Other intervertebral disc degeneration, lumbar region: Secondary | ICD-10-CM | POA: Diagnosis not present

## 2020-01-15 DIAGNOSIS — I83893 Varicose veins of bilateral lower extremities with other complications: Secondary | ICD-10-CM | POA: Diagnosis not present

## 2020-01-15 DIAGNOSIS — I872 Venous insufficiency (chronic) (peripheral): Secondary | ICD-10-CM | POA: Diagnosis not present

## 2020-01-15 DIAGNOSIS — B9689 Other specified bacterial agents as the cause of diseases classified elsewhere: Secondary | ICD-10-CM | POA: Diagnosis not present

## 2020-01-15 DIAGNOSIS — F1120 Opioid dependence, uncomplicated: Secondary | ICD-10-CM | POA: Diagnosis not present

## 2020-01-15 DIAGNOSIS — Z79899 Other long term (current) drug therapy: Secondary | ICD-10-CM | POA: Diagnosis not present

## 2020-01-15 MED FILL — AMOX-CLAV 500-125 MG TABLET: 500-125 | 7 days supply | Qty: 14 | Fill #0

## 2020-01-15 NOTE — Progress Notes (Signed)
Requested by:  Shirlee Latch, NP [address] [place],  [place] [address]  Reason for consultation: bilateral lower extremity spider veins and varicose veins   History of Present Illness   Brianna Martin is a 29 y.o. (Jun 18, 1991) female who presents for evaluation of bilateral lower extremity spider veins and varicose veins. She says these have been present for many years but more recently she feels like they are increasing and more noticeable. They are both on the thighs, behind knees and in lower legs. She says that they are not painful. They do not itch, burn, sting or pinch. She will have heaviness and fatigue in her legs especially at the end of the day. She works as a CMA standing long hours daily. She does not frequently elevate her legs except while sleeping. She has worn knee high compression stockings in the past at time of foot surgery, but she says she never noticed them helping her legs. She reports swelling intermittently of her feet but not of her legs. She has had significant fluctuation in her weight over last couple years which may contribute to this. She does have two small children at home as well. She has significant family history of CAD but no DVT. She does have paternal grandmother with significant varicose veins  Venous symptoms include: aching, heavy, tired Onset/duration:  Many years Occupation:  CMA Aggravating factors: prolonged standing Alleviating factors: elevation  Compression:  Yes, at time of foot surgery several years ago  Helps:  no Pain medications: no Previous vein procedures:  no History of DVT: no  Past Medical History:  Diagnosis Date  . Anxiety   . Asthma   . Depression   . History of PCOS   . MRSA (methicillin resistant Staphylococcus aureus)    x2 Rt knee Lt elbow  . Neck pain, chronic     Past Surgical History:  Procedure Laterality Date  . ADENOIDECTOMY    . BUNIONECTOMY Left 08/11/2018   Procedure: TAYLOR'S  BUNIONECTOMY LEFT FOOT;  Surgeon: Asencion Islam, DPM;  Location: [facility];  Service: Podiatry;  Laterality: Left;  . CESAREAN SECTION    . DILATION AND CURETTAGE OF UTERUS    . EXPLORATORY LAPAROTOMY    . HAMMER TOE SURGERY Left 08/11/2018   Procedure: 5TH HAMMER TOE CORRECTION LEFT FOOT;  Surgeon: Asencion Islam, DPM;  Location: [facility];  Service: Podiatry;  Laterality: Left;  . TONSILLECTOMY    . WISDOM TOOTH EXTRACTION      Social History   Socioeconomic History  . Marital status: Married    Spouse name: Not on file  . Number of children: 2  . Years of education: Not on file  . Highest education level: Not on file  Occupational History  . Not on file  Tobacco Use  . Smoking status: Former Smoker    Packs/day: 0.50    Years: 2.00    Pack years: 1.00    Types: Cigarettes    Quit date: 06/21/2016    Years since quitting: 3.5  . Smokeless tobacco: Never Used  . Tobacco comment: no cigs, pt vapes now since 05/2016  Vaping Use  . Vaping Use: Every day  . Substances: Nicotine, Flavoring  Substance and Sexual Activity  . Alcohol use: No  . Drug use: No  . Sexual activity: Yes    Birth control/protection: None  Other Topics Concern  . Not on file  Social History Narrative  . Not on  file   Social Determinants of Health   Financial Resource Strain:   . Difficulty of Paying Living Expenses:   Food Insecurity:   . Worried About Programme researcher, broadcasting/film/video in the Last Year:   . Barista in the Last Year:   Transportation Needs:   . Freight forwarder (Medical):   Marland Kitchen Lack of Transportation (Non-Medical):   Physical Activity:   . Days of Exercise per Week:   . Minutes of Exercise per Session:   Stress:   . Feeling of Stress :   Social Connections:   . Frequency of Communication with Friends and Family:   . Frequency of Social Gatherings with Friends and Family:   . Attends Religious Services:   . Active Member of Clubs or  Organizations:   . Attends Banker Meetings:   Marland Kitchen Marital Status:   Intimate Partner Violence:   . Fear of Current or Ex-Partner:   . Emotionally Abused:   Marland Kitchen Physically Abused:   . Sexually Abused:     Family History  Problem Relation Age of Onset  . Urticaria Neg Hx   . Immunodeficiency Neg Hx   . Eczema Neg Hx   . Atopy Neg Hx   . Asthma Neg Hx   . Angioedema Neg Hx   . Allergic rhinitis Neg Hx     Current Outpatient Medications  Medication Sig Dispense Refill  . Biotin 5000 MCG TABS Take by mouth.    Marland Kitchen buPROPion (WELLBUTRIN XL) 150 MG 24 hr tablet Take 150 mg by mouth daily.    Marland Kitchen escitalopram (LEXAPRO) 20 MG tablet Take 20 mg by mouth daily.    Marland Kitchen gabapentin (NEURONTIN) 300 MG capsule Take 300 mg by mouth 3 (three) times daily.    Marland Kitchen ibuprofen (ADVIL) 800 MG tablet Take 1 tablet (800 mg total) by mouth every 8 (eight) hours as needed. 30 tablet 0  . ipratropium (ATROVENT) 0.06 % nasal spray Use two sprays in each nostril twice daily as directed. 45 mL 5  . LORazepam (ATIVAN) 0.5 MG tablet     . metFORMIN (GLUCOPHAGE) 500 MG tablet Take 250 mg by mouth daily with breakfast.    . montelukast (SINGULAIR) 10 MG tablet Take 1 tablet (10 mg total) by mouth at bedtime. 90 tablet 0  . nortriptyline (PAMELOR) 50 MG capsule     . oxyCODONE-acetaminophen (PERCOCET) 10-325 MG tablet     . spironolactone (ALDACTONE) 100 MG tablet Take 100 mg by mouth daily.    . tizanidine (ZANAFLEX) 2 MG capsule TAKE 1 CAPSULE BY MOUTH 3 TIMES DAILY AS NEEDED. (START WITH 1 CAPSULE ONCE THEN MAY REPEAT IN 8 HOURS)    . topiramate (TOPAMAX) 50 MG tablet Take 50 mg by mouth 2 (two) times daily.    Marland Kitchen zolpidem (AMBIEN) 10 MG tablet Take 10 mg by mouth at bedtime as needed.     No current facility-administered medications for this visit.    No Known Allergies  REVIEW OF SYSTEMS (negative unless checked):   Cardiac:  []  Chest pain or chest pressure? []  Shortness of breath upon activity? []   Shortness of breath when lying flat? []  Irregular heart rhythm?  Vascular:  []  Pain in calf, thigh, or hip brought on by walking? []  Pain in feet at night that wakes you up from your sleep? []  Blood clot in your veins? []  Leg swelling?  Pulmonary:  []  Oxygen at home? []  Productive cough? []  Wheezing?  Neurologic:  []   Sudden weakness in arms or legs? []  Sudden numbness in arms or legs? []  Sudden onset of difficult speaking or slurred speech? []  Temporary loss of vision in one eye? []  Problems with dizziness?  Gastrointestinal:  []  Blood in stool? []  Vomited blood?  Genitourinary:  []  Burning when urinating? []  Blood in urine?  Psychiatric:  []  Major depression  Hematologic:  []  Bleeding problems? []  Problems with blood clotting?  Dermatologic:  []  Rashes or ulcers?  Constitutional:  []  Fever or chills?  Ear/Nose/Throat:  []  Change in hearing? []  Nose bleeds? []  Sore throat?  Musculoskeletal:  []  Back pain? []  Joint pain? []  Muscle pain?   Physical Examination     Vitals:   01/15/20 1354  BP: 107/82  Pulse: 104  Resp: 20  Temp: 98 F (36.7 C)  TempSrc: Temporal  SpO2: 96%  Weight: 201 lb 14.4 oz (91.6 kg)  Height: 5\' 2"  (1.575 m)   Body mass index is 36.93 kg/m.  General:  WDWN in NAD; vital signs documented above Gait: Normal HENT: WNL, normocephalic Pulmonary: normal non-labored breathing , without wheezing Cardiac: regular HR, without  Murmurs Vascular Exam/Pulses:  Right Left  Radial 2+ (normal) 2+ (normal)  Femoral 2+ (normal) 2+ (normal)  Popliteal 2+ (normal) 2+ (normal)  DP 2+ (normal) 2+ (normal)  PT 2+ (normal) 2+ (normal)   Extremities: with varicose veins of bilateral thighs and popliteal fossa, with reticular veins bilateral thighs and lower legs, without edema, without stasis pigmentation, without lipodermatosclerosis, without ulcers Musculoskeletal: no muscle wasting or atrophy  Neurologic: A&O X 3;  No focal weakness  or paresthesias are detected Psychiatric:  The pt has Normal affect.  Non-invasive Vascular Imaging   BLE Venous Insufficiency Duplex (01/15/20):   RLE:   No DVT and SVT  GSV reflux at [facility] and prox calf  GSV diameter 0.271-0.67 at [facility]  No SSV reflux  CFV deep venous reflux   LLE:  No DVT and SVT,   GSV reflux at [facility]   GSV diameter 0.164 -0.554 at [facility]  SSV reflux   CFV deep venous reflux  AASV prox thigh with reflux   Medical Decision Making    Brianna Martin is a 29 y.o. female who presents with: BLE chronic venous insufficiency, bilateral varicose veins and reticular veins. She has bilateral deep reflux in common femoral veins and in the GSV bilaterally at the [facility] however her veins are very small in both legs. She does have Anterior accessory vein in left thigh that is also refluxing but it is also small. She is not very symptomatic from this although she does have multiple visible varicosities and reticular veins on her legs   Based on the patient's history and examination, I recommend conservative therapy with elevation, compression, and exercise therapy  Provided reassurance that she does not have DVT and that this is not a severe limb threatening issue  I discussed with the patient the use of her 15-20 mm thigh high compression stockings would benefit her although she also can continue to use the pair of knee high compression stockings she has at home  Based on her non invasive study today she would not be candidate for further more invasive venous interventions at this time  Advised her to follow up if she develops new or worsening symptoms  Thank you for allowing to participate in this patient's care.   , PA-C [facility] Office: [phone]  01/15/2020, 3:32 PM  On  Call MD: Dr. Chestine Sporelark

## 2020-01-16 MED FILL — OXYCODONE-APAP 10-325: 10-325 | 30 days supply | Qty: 120 | Fill #0

## 2020-02-04 MED FILL — GABAPENTIN 300 MG CAPSULE: 300 | 30 days supply | Qty: 90 | Fill #0

## 2020-02-04 MED FILL — buPROPion HCL ER (XL) 150 M: 150 | 90 days supply | Qty: 90 | Fill #0

## 2020-02-09 ENCOUNTER — Other Ambulatory Visit: Payer: 59 | Admitting: Orthotics

## 2020-02-09 DIAGNOSIS — Z3200 Encounter for pregnancy test, result unknown: Secondary | ICD-10-CM | POA: Diagnosis not present

## 2020-02-09 DIAGNOSIS — Z79899 Other long term (current) drug therapy: Secondary | ICD-10-CM | POA: Diagnosis not present

## 2020-02-09 DIAGNOSIS — M5136 Other intervertebral disc degeneration, lumbar region: Secondary | ICD-10-CM | POA: Diagnosis not present

## 2020-02-09 DIAGNOSIS — F1120 Opioid dependence, uncomplicated: Secondary | ICD-10-CM | POA: Diagnosis not present

## 2020-02-09 DIAGNOSIS — Z79891 Long term (current) use of opiate analgesic: Secondary | ICD-10-CM | POA: Diagnosis not present

## 2020-03-02 MED FILL — GABAPENTIN 300 MG CAPSULE: 300 | 30 days supply | Qty: 90 | Fill #1

## 2020-03-02 MED FILL — SPIRONOLACTONE 100 MG TAB: 100 | 90 days supply | Qty: 90 | Fill #1

## 2020-03-02 MED FILL — METFORMIN HCL 500 MG TABS: 500 | 90 days supply | Qty: 180 | Fill #1

## 2020-03-03 MED FILL — ESCITALOPRAM 20 MG TABLET: 20 | 90 days supply | Qty: 90 | Fill #1

## 2020-03-08 DIAGNOSIS — Z79891 Long term (current) use of opiate analgesic: Secondary | ICD-10-CM | POA: Diagnosis not present

## 2020-03-08 DIAGNOSIS — M5136 Other intervertebral disc degeneration, lumbar region: Secondary | ICD-10-CM | POA: Diagnosis not present

## 2020-03-08 DIAGNOSIS — F1120 Opioid dependence, uncomplicated: Secondary | ICD-10-CM | POA: Diagnosis not present

## 2020-03-08 DIAGNOSIS — Z3200 Encounter for pregnancy test, result unknown: Secondary | ICD-10-CM | POA: Diagnosis not present

## 2020-03-08 DIAGNOSIS — Z79899 Other long term (current) drug therapy: Secondary | ICD-10-CM | POA: Diagnosis not present

## 2020-04-05 DIAGNOSIS — Z3200 Encounter for pregnancy test, result unknown: Secondary | ICD-10-CM | POA: Diagnosis not present

## 2020-04-05 DIAGNOSIS — F1120 Opioid dependence, uncomplicated: Secondary | ICD-10-CM | POA: Diagnosis not present

## 2020-04-05 DIAGNOSIS — Z79899 Other long term (current) drug therapy: Secondary | ICD-10-CM | POA: Diagnosis not present

## 2020-04-05 DIAGNOSIS — M5136 Other intervertebral disc degeneration, lumbar region: Secondary | ICD-10-CM | POA: Diagnosis not present

## 2020-04-06 ENCOUNTER — Other Ambulatory Visit: Payer: Self-pay | Admitting: *Deleted

## 2020-04-06 DIAGNOSIS — Z20822 Contact with and (suspected) exposure to covid-19: Secondary | ICD-10-CM | POA: Diagnosis not present

## 2020-04-07 LAB — SARS-COV-2, NAA 2 DAY TAT

## 2020-04-07 LAB — NOVEL CORONAVIRUS, NAA: SARS-CoV-2, NAA: NOT DETECTED

## 2020-04-12 ENCOUNTER — Telehealth: Payer: Self-pay | Admitting: *Deleted

## 2020-04-12 NOTE — Telephone Encounter (Signed)
Patient called and left message stating she had been seen in July by Coralie Carpen and was told if she had any changes in her symptoms to call and schedule an appt. Patient states she is having bilateral leg swelling. She was seen here for evaluation of varicose veins. Tried to call patient back got voice mail left her a message somenone from our office would be calling to schedule an appt for her. Information given to [facility] to schedule patient with Edilia Bo or Fields.

## 2020-04-14 ENCOUNTER — Ambulatory Visit: Payer: 59 | Admitting: Vascular Surgery

## 2020-04-27 ENCOUNTER — Encounter: Payer: Self-pay | Admitting: Vascular Surgery

## 2020-04-27 ENCOUNTER — Ambulatory Visit (INDEPENDENT_AMBULATORY_CARE_PROVIDER_SITE_OTHER): Payer: 59 | Admitting: Vascular Surgery

## 2020-04-27 ENCOUNTER — Other Ambulatory Visit: Payer: Self-pay | Admitting: *Deleted

## 2020-04-27 ENCOUNTER — Ambulatory Visit (HOSPITAL_COMMUNITY)
Admission: RE | Admit: 2020-04-27 | Discharge: 2020-04-27 | Disposition: A | Discharge status: Home / Self Care | Payer: 59 | Source: Ambulatory Visit | Attending: Vascular Surgery | Admitting: Vascular Surgery

## 2020-04-27 ENCOUNTER — Other Ambulatory Visit: Payer: Self-pay

## 2020-04-27 VITALS — BP 107/76 | HR 103 | Resp 18 | Ht 62.0 in | Wt 219.9 lb

## 2020-04-27 DIAGNOSIS — M79669 Pain in unspecified lower leg: Secondary | ICD-10-CM

## 2020-04-27 DIAGNOSIS — R609 Edema, unspecified: Secondary | ICD-10-CM

## 2020-04-27 NOTE — Addendum Note (Signed)
Addended by: Domenic Moras D on: 04/27/2020 03:08 PM   Modules accepted: Orders

## 2020-04-27 NOTE — Progress Notes (Signed)
Patient name: Brianna Martin MRN: [id number] DOB: Oct 01, 1990 Sex: female  HPI: Brianna Martin is a 29 y.o. female, last seen in July of this year for varicose veins with intermittent swelling.  Patient was advised to return if she had worsening of symptoms.  She states that over the last few weeks she has had increasing edema in both lower extremities.  These are both fairly even.  She states the edema progresses as the day goes on.  She does not have any recent travel history.  She has not been any more sedentary than usual.  She has been wearing her compression stockings some but not daily.  She states she does not really think they helped much.  He does not know of any prior history of hepatic or renal dysfunction.  However, she has not seen a primary care physician for quite some time because she was concerned about cost.  She has no shortness of breath.  She has no chest pain.  Past Medical History:  Diagnosis Date  . Anxiety   . Asthma   . Depression   . History of PCOS   . MRSA (methicillin resistant Staphylococcus aureus)    x2 Rt knee Lt elbow  . Neck pain, chronic    Past Surgical History:  Procedure Laterality Date  . ADENOIDECTOMY    . BUNIONECTOMY Left 08/11/2018   Procedure: TAYLOR'S BUNIONECTOMY LEFT FOOT;  Surgeon: Landis Martins, DPM;  Location: [facility];  Service: Podiatry;  Laterality: Left;  . CESAREAN SECTION    . DILATION AND CURETTAGE OF UTERUS    . EXPLORATORY LAPAROTOMY    . HAMMER TOE SURGERY Left 08/11/2018   Procedure: 5TH HAMMER TOE CORRECTION LEFT FOOT;  Surgeon: Landis Martins, DPM;  Location: [facility];  Service: Podiatry;  Laterality: Left;  . TONSILLECTOMY    . WISDOM TOOTH EXTRACTION      Family History  Problem Relation Age of Onset  . Urticaria Neg Hx   . Immunodeficiency Neg Hx   . Eczema Neg Hx   . Atopy Neg Hx   . Asthma Neg Hx   . Angioedema Neg Hx   . Allergic rhinitis Neg Hx     SOCIAL  HISTORY: Social History   Socioeconomic History  . Marital status: Married    Spouse name: Not on file  . Number of children: 2  . Years of education: Not on file  . Highest education level: Not on file  Occupational History  . Not on file  Tobacco Use  . Smoking status: Former Smoker    Packs/day: 0.50    Years: 2.00    Pack years: 1.00    Types: Cigarettes    Quit date: 06/21/2016    Years since quitting: 3.8  . Smokeless tobacco: Never Used  . Tobacco comment: no cigs, pt vapes now since 05/2016  Vaping Use  . Vaping Use: Every day  . Substances: Nicotine, Flavoring  Substance and Sexual Activity  . Alcohol use: No  . Drug use: No  . Sexual activity: Yes    Birth control/protection: None  Other Topics Concern  . Not on file  Social History Narrative  . Not on file   Social Determinants of Health   Financial Resource Strain:   . Difficulty of Paying Living Expenses: Not on file  Food Insecurity:   . Worried About Charity fundraiser in the Last Year: Not on file  . Ran Out of Food  in the Last Year: Not on file  Transportation Needs:   . Lack of Transportation (Medical): Not on file  . Lack of Transportation (Non-Medical): Not on file  Physical Activity:   . Days of Exercise per Week: Not on file  . Minutes of Exercise per Session: Not on file  Stress:   . Feeling of Stress : Not on file  Social Connections:   . Frequency of Communication with Friends and Family: Not on file  . Frequency of Social Gatherings with Friends and Family: Not on file  . Attends Religious Services: Not on file  . Active Member of Clubs or Organizations: Not on file  . Attends Archivist Meetings: Not on file  . Marital Status: Not on file  Intimate Partner Violence:   . Fear of Current or Ex-Partner: Not on file  . Emotionally Abused: Not on file  . Physically Abused: Not on file  . Sexually Abused: Not on file    No Known Allergies  Current Outpatient Medications   Medication Sig Dispense Refill  . buPROPion (WELLBUTRIN XL) 150 MG 24 hr tablet Take 150 mg by mouth daily.    Marland Kitchen escitalopram (LEXAPRO) 20 MG tablet Take 20 mg by mouth daily.    Marland Kitchen gabapentin (NEURONTIN) 300 MG capsule Take 300 mg by mouth 3 (three) times daily.    Marland Kitchen ibuprofen (ADVIL) 800 MG tablet Take 1 tablet (800 mg total) by mouth every 8 (eight) hours as needed. 30 tablet 0  . ipratropium (ATROVENT) 0.06 % nasal spray Use two sprays in each nostril twice daily as directed. 45 mL 5  . metFORMIN (GLUCOPHAGE) 500 MG tablet Take 250 mg by mouth daily with breakfast.    . nortriptyline (PAMELOR) 50 MG capsule     . oxyCODONE-acetaminophen (PERCOCET) 10-325 MG tablet     . spironolactone (ALDACTONE) 100 MG tablet Take 100 mg by mouth daily.    . tizanidine (ZANAFLEX) 2 MG capsule TAKE 1 CAPSULE BY MOUTH 3 TIMES DAILY AS NEEDED. (START WITH 1 CAPSULE ONCE THEN MAY REPEAT IN 8 HOURS)    . Biotin 5000 MCG TABS Take by mouth. (Patient not taking: Reported on 04/27/2020)    . LORazepam (ATIVAN) 0.5 MG tablet  (Patient not taking: Reported on 04/27/2020)    . montelukast (SINGULAIR) 10 MG tablet Take 1 tablet (10 mg total) by mouth at bedtime. (Patient not taking: Reported on 04/27/2020) 90 tablet 0  . topiramate (TOPAMAX) 50 MG tablet Take 50 mg by mouth 2 (two) times daily. (Patient not taking: Reported on 04/27/2020)    . zolpidem (AMBIEN) 10 MG tablet Take 10 mg by mouth at bedtime as needed. (Patient not taking: Reported on 04/27/2020)     No current facility-administered medications for this visit.    ROS:   General:  No weight loss, Fever, chills  HEENT: No recent headaches, no nasal bleeding, no visual changes, no sore throat  Neurologic: No dizziness, blackouts, seizures. No recent symptoms of stroke or mini- stroke. No recent episodes of slurred speech, or temporary blindness.  Cardiac: No recent episodes of chest pain/pressure, no shortness of breath at rest.  No shortness of breath  with exertion.  Denies history of atrial fibrillation or irregular heartbeat  Vascular: No history of rest pain in feet.  No history of claudication.  No history of non-healing ulcer, No history of DVT   Pulmonary: No home oxygen, no productive cough, no hemoptysis,  No asthma or wheezing  Musculoskeletal:  _0   Arthritis, _0  Low back pain,  _1  Joint pain  Hematologic:No history of hypercoagulable state.  No history of easy bleeding.  No history of anemia  Gastrointestinal: No hematochezia or melena,  No gastroesophageal reflux, no trouble swallowing  Urinary: _2  chronic Kidney disease, _3  on HD - _4  MWF or _5  TTHS, _6  Burning with urination, _7  Frequent urination, _8  Difficulty urinating;   Skin: No rashes  Psychological: No history of anxiety,  No history of depression   Physical Examination  Vitals:   04/27/20 1327  BP: 107/76  Pulse: (!) 103  Resp: 18  SpO2: 99%  Weight: 219 lb 14.4 oz (99.7 kg)  Height: _9  (1.575 m)    Body mass index is 40.22 kg/m.  General:  Alert and oriented, no acute distress HEENT: Normal Neck: No JVD Cardiac: Regular Rate and Rhythm Skin: No rash Extremity Pulses:  2+dorsalis pedis pulses bilaterally Musculoskeletal: No deformity trace pretibial edema bilaterally  Neurologic: Upper and lower extremity motor 5/5 and symmetric  DATA:  I reviewed the patient's recent reflux exam which showed common femoral vein reflux and saphenofemoral junction reflux but vein less than 2 mm in the greater saphenous.  Patient had bilateral lower extremity DVT ultrasound today.  This showed no evidence of DVT.  I reviewed and interpreted the study.  ASSESSMENT: Bilateral leg edema progressive since 3 months ago.  Prior reflux ultrasound 3 months ago showed reflux but vein diameter less than 2 mm.  Ruled out DVT today   PLAN: We will check be met and hepatic function as other potential sources for leg edema.  I will call the patient with the  results of these.  Patient was counseled on weight loss and wearing lower extremity compressions stockings to control her symptoms.  I have encouraged her to establish herself with a primary care physician so that she can be followed long-term for this.   Ruta Hinds, MD [facility] Office: [phone]

## 2020-04-28 DIAGNOSIS — R609 Edema, unspecified: Secondary | ICD-10-CM | POA: Diagnosis not present

## 2020-05-02 DIAGNOSIS — G8929 Other chronic pain: Secondary | ICD-10-CM | POA: Diagnosis not present

## 2020-05-02 DIAGNOSIS — Z79899 Other long term (current) drug therapy: Secondary | ICD-10-CM | POA: Diagnosis not present

## 2020-05-02 DIAGNOSIS — Z3200 Encounter for pregnancy test, result unknown: Secondary | ICD-10-CM | POA: Diagnosis not present

## 2020-05-02 DIAGNOSIS — M542 Cervicalgia: Secondary | ICD-10-CM | POA: Diagnosis not present

## 2020-05-02 DIAGNOSIS — M545 Low back pain, unspecified: Secondary | ICD-10-CM | POA: Diagnosis not present

## 2020-05-10 DIAGNOSIS — Z20822 Contact with and (suspected) exposure to covid-19: Secondary | ICD-10-CM | POA: Diagnosis not present

## 2020-05-10 DIAGNOSIS — R112 Nausea with vomiting, unspecified: Secondary | ICD-10-CM | POA: Diagnosis not present

## 2020-05-10 DIAGNOSIS — U071 COVID-19: Secondary | ICD-10-CM | POA: Diagnosis not present

## 2020-05-10 MED FILL — TIZANIDINE HCL 2 MG CAPS: 2 | 30 days supply | Qty: 90 | Fill #0

## 2020-05-10 MED FILL — buPROPion HCL ER (XL) 150 M: 150 | 90 days supply | Qty: 90 | Fill #1

## 2020-05-30 DIAGNOSIS — G8929 Other chronic pain: Secondary | ICD-10-CM | POA: Diagnosis not present

## 2020-05-30 DIAGNOSIS — M545 Low back pain, unspecified: Secondary | ICD-10-CM | POA: Diagnosis not present

## 2020-05-30 DIAGNOSIS — R5383 Other fatigue: Secondary | ICD-10-CM | POA: Diagnosis not present

## 2020-05-30 DIAGNOSIS — R600 Localized edema: Secondary | ICD-10-CM | POA: Diagnosis not present

## 2020-05-30 DIAGNOSIS — Z3200 Encounter for pregnancy test, result unknown: Secondary | ICD-10-CM | POA: Diagnosis not present

## 2020-05-30 DIAGNOSIS — Z79899 Other long term (current) drug therapy: Secondary | ICD-10-CM | POA: Diagnosis not present

## 2020-06-21 MED FILL — ESCITALOPRAM 20 MG TABLET: 20 | 90 days supply | Qty: 90 | Fill #2

## 2020-06-29 DIAGNOSIS — Z79891 Long term (current) use of opiate analgesic: Secondary | ICD-10-CM | POA: Diagnosis not present

## 2020-06-29 DIAGNOSIS — Z3200 Encounter for pregnancy test, result unknown: Secondary | ICD-10-CM | POA: Diagnosis not present

## 2020-06-29 DIAGNOSIS — F1120 Opioid dependence, uncomplicated: Secondary | ICD-10-CM | POA: Diagnosis not present

## 2020-06-29 DIAGNOSIS — M545 Low back pain, unspecified: Secondary | ICD-10-CM | POA: Diagnosis not present

## 2020-06-29 DIAGNOSIS — G8929 Other chronic pain: Secondary | ICD-10-CM | POA: Diagnosis not present

## 2020-06-29 DIAGNOSIS — Z79899 Other long term (current) drug therapy: Secondary | ICD-10-CM | POA: Diagnosis not present

## 2020-09-15 ENCOUNTER — Other Ambulatory Visit (HOSPITAL_BASED_OUTPATIENT_CLINIC_OR_DEPARTMENT_OTHER): Payer: Self-pay

## 2022-11-16 ENCOUNTER — Encounter (HOSPITAL_COMMUNITY): Payer: Self-pay

## 2022-11-16 ENCOUNTER — Inpatient Hospital Stay: Admit: 2022-11-16 | Payer: Commercial Managed Care - PPO | Admitting: Internal Medicine

## 2022-11-16 NOTE — Plan of Care (Signed)
Plan of Care Note for Accepted Transfer   Patient: Brianna Martin    WUJ:[id number]     Facility requesting transfer: [facility] Requesting Provider: Dr. Georgiana Shore, General Surgery  40F with history of cocaine and cannabis use who underwent lap chole today and IOC reveals CBD stone. Requesting transfer due to no GI coverage. Hemodynamically stable, unremarkable recent labs who only very slight LFT elevation Tbili 1.6.   Most recent vitals, labs and radiology in [facility] system:       Latest Ref Rng & Units 10/17/2018   10:37 AM  CBC  WBC 3.4 - 10.8 x10E3/uL 5.4   Hemoglobin 11.1 - 15.9 g/dL 95.6   Hematocrit 21.3 - 46.6 % 39.4   Platelets 150 - 450 x10E3/uL 240       Latest Ref Rng & Units 08/08/2018    3:05 PM  BMP  Glucose 70 - 99 mg/dL 086   BUN 6 - 20 mg/dL 14   Creatinine 5.78 - 1.00 mg/dL 4.69   Sodium 629 - 528 mmol/L 141   Potassium 3.5 - 5.1 mmol/L 5.3   Chloride 98 - 111 mmol/L 108   CO2 22 - 32 mmol/L 27   Calcium 8.9 - 10.3 mg/dL 9.7      The patient has been accepted for transfer to [facility] or [facility], depending on bed and resource availability. The patient will remain under the care and responsibility of the referring provider until they have arrived to our inpatient facility.  Author: Lora Glomski Sharlette Dense, MD  11/16/2022  Check [email or web address] for on-call coverage.  Nursing staff, Please call [facility] Admits & Consults System-Wide number on Amion as soon as patient's arrival, so appropriate admitting provider can evaluate the pt.
